# Patient Record
Sex: Male | Born: 1980
Health system: Southern US, Community
[De-identification: ages and names within clinical notes are randomized; demographics above are authoritative.]

## PROBLEM LIST (undated history)

## (undated) DIAGNOSIS — I10 Essential (primary) hypertension: Secondary | ICD-10-CM

## (undated) DIAGNOSIS — F419 Anxiety disorder, unspecified: Secondary | ICD-10-CM

## (undated) DIAGNOSIS — T7840XA Allergy, unspecified, initial encounter: Secondary | ICD-10-CM

## (undated) DIAGNOSIS — G4730 Sleep apnea, unspecified: Secondary | ICD-10-CM

## (undated) DIAGNOSIS — F32A Depression, unspecified: Secondary | ICD-10-CM

## (undated) HISTORY — DX: Sleep apnea, unspecified: G47.30

## (undated) HISTORY — DX: Allergy, unspecified, initial encounter: T78.40XA

## (undated) HISTORY — DX: Anxiety disorder, unspecified: F41.9

## (undated) HISTORY — DX: Depression, unspecified: F32.A

---

## 2002-02-02 ENCOUNTER — Emergency Department (HOSPITAL_COMMUNITY): Admission: EM | Admit: 2002-02-02 | Discharge: 2002-02-02 | Payer: Self-pay | Admitting: Emergency Medicine

## 2005-09-24 ENCOUNTER — Emergency Department (HOSPITAL_COMMUNITY): Admission: EM | Admit: 2005-09-24 | Discharge: 2005-09-24 | Payer: Self-pay | Admitting: Emergency Medicine

## 2006-07-11 ENCOUNTER — Emergency Department (HOSPITAL_COMMUNITY): Admission: EM | Admit: 2006-07-11 | Discharge: 2006-07-11 | Payer: Self-pay | Admitting: Emergency Medicine

## 2006-11-16 ENCOUNTER — Emergency Department (HOSPITAL_COMMUNITY): Admission: EM | Admit: 2006-11-16 | Discharge: 2006-11-16 | Payer: Self-pay | Admitting: Emergency Medicine

## 2020-04-06 ENCOUNTER — Emergency Department (HOSPITAL_COMMUNITY)
Admission: EM | Admit: 2020-04-06 | Discharge: 2020-04-06 | Disposition: A | Discharge status: Home / Self Care | Payer: BLUE CROSS/BLUE SHIELD | Attending: Emergency Medicine | Admitting: Emergency Medicine

## 2020-04-06 ENCOUNTER — Encounter (HOSPITAL_COMMUNITY): Payer: Self-pay

## 2020-04-06 ENCOUNTER — Other Ambulatory Visit: Payer: Self-pay

## 2020-04-06 DIAGNOSIS — L0291 Cutaneous abscess, unspecified: Secondary | ICD-10-CM

## 2020-04-06 DIAGNOSIS — H6003 Abscess of external ear, bilateral: Secondary | ICD-10-CM | POA: Insufficient documentation

## 2020-04-06 DIAGNOSIS — F17210 Nicotine dependence, cigarettes, uncomplicated: Secondary | ICD-10-CM | POA: Diagnosis not present

## 2020-04-06 MED ORDER — DOXYCYCLINE HYCLATE 100 MG PO CAPS: 100.0000 mg | ORAL_CAPSULE | Freq: Two times a day (BID) | ORAL | 0 refills | Status: AC

## 2020-04-06 MED ORDER — LIDOCAINE-EPINEPHRINE 2 %-1:100000 IJ SOLN
20.0000 mL | Freq: Once | INTRAMUSCULAR | Status: AC
  Administered 2020-04-06: 20 mL via INTRADERMAL
  Filled 2020-04-06: qty 1

## 2020-04-06 NOTE — ED Triage Notes (Signed)
Patient states he has had an abscess behind his right earlobe x 3 weeks.

## 2020-04-06 NOTE — Discharge Instructions (Signed)
You were given a prescription for antibiotics. Please take the antibiotic prescription fully.   Please follow up with your primary care provider within 5-7 days for re-evaluation of your symptoms. If you do not have a primary care provider, information for a healthcare clinic has been provided for you to make arrangements for follow up care. Please return to the emergency department for any new or worsening symptoms.  

## 2020-04-06 NOTE — ED Provider Notes (Signed)
[facility]-EMERGENCY DEPT Provider Note   CSN: [id number] Arrival date & time: 04/06/20  1157     History Chief Complaint  Patient presents with   Abscess    Justin Wilson is a 39 y.o. male.  HPI   39 year old male presenting for evaluation of concern for abscesses behind his bilateral ears.  Symptoms have been present for 1 to 3 weeks.  Reports pain and swelling to the areas.  Has had no drainage.  Denies any current fevers.  History reviewed. No pertinent past medical history.  There are no problems to display for this patient.   History reviewed. No pertinent surgical history.     Family History  Family history unknown: Yes    Social History   Tobacco Use   Smoking status: Current Some Day Smoker    Types: Cigarettes   Smokeless tobacco: Never Used  Vaping Use   Vaping Use: Never used  Substance Use Topics   Alcohol use: Yes   Drug use: Yes    Types: Marijuana    Home Medications Prior to Admission medications   Medication Sig Start Date End Date Taking? Authorizing Provider  doxycycline (VIBRAMYCIN) 100 MG capsule Take 1 capsule (100 mg total) by mouth 2 (two) times daily for 7 days. 04/06/20 04/13/20  Madason Rauls S, PA-C    Allergies    Patient has no known allergies.  Review of Systems   Review of Systems  Constitutional: Negative for fever.  Skin:       abscess    Physical Exam Updated Vital Signs BP (!) 150/78    Pulse 80    Temp 98.1 F (36.7 C) (Oral)    Resp 16    Ht 6\' 3"  (1.905 m)    SpO2 97%   Physical Exam Constitutional:      General: He is not in acute distress.    Appearance: He is well-developed.  HENT:     Ears:     Comments: 1.5 cm area of fluctuance/erythema posterior to the right earlobe. 0.5 cm area of fluctuance posterior to the left earlobe. Eyes:     Conjunctiva/sclera: Conjunctivae normal.  Cardiovascular:     Rate and Rhythm: Normal rate.  Pulmonary:     Effort: Pulmonary  effort is normal.  Skin:    General: Skin is warm and dry.  Neurological:     Mental Status: He is alert and oriented to person, place, and time.     ED Results / Procedures / Treatments   Labs (all labs ordered are listed, but only abnormal results are displayed) Labs Reviewed - No data to display  EKG None  Radiology No results found.  Procedures . Incision and Drainage  Date/Time: 04/06/2020 1:43 PM Performed by: 04/08/2020, PA-C Authorized by: Karrie Meres, PA-C   Consent:    Consent obtained:  Verbal   Consent given by:  Patient   Risks discussed:  Bleeding, incomplete drainage, pain and damage to other organs   Alternatives discussed:  No treatment Universal protocol:    Procedure explained and questions answered to patient or proxy's satisfaction: yes     Relevant documents present and verified: yes     Test results available and properly labeled: yes     Imaging studies available: yes     Required blood products, implants, devices, and special equipment available: yes     Site/side marked: yes     Immediately prior to procedure a time out was  called: yes     Patient identity confirmed:  Verbally with patient Location:    Type:  Abscess   Size:  1cm   Location: right posterior ear. Pre-procedure details:    Skin preparation:  Betadine Anesthesia (see MAR for exact dosages):    Anesthesia method:  Local infiltration   Local anesthetic:  Lidocaine 2% WITH epi Procedure type:    Complexity:  Complex Procedure details:    Incision types:  Single straight   Incision depth:  Subcutaneous   Scalpel blade:  11   Drainage:  Purulent   Drainage amount:  Moderate   Wound treatment:  Wound left open Post-procedure details:    Patient tolerance of procedure:  Tolerated well, no immediate complications .Marland KitchenIncision and Drainage  Date/Time: 04/06/2020 1:44 PM Performed by: Karrie Meres, PA-C Authorized by: Karrie Meres, PA-C   Consent:     Consent obtained:  Verbal   Consent given by:  Patient   Risks discussed:  Bleeding, incomplete drainage and pain   Alternatives discussed:  No treatment Location:    Type:  Abscess   Size:  0.5   Location: left posterior ear. Pre-procedure details:    Skin preparation:  Betadine Anesthesia (see MAR for exact dosages):    Anesthesia method:  Local infiltration   Local anesthetic:  Lidocaine 2% WITH epi Procedure type:    Complexity:  Simple Procedure details:    Incision types:  Stab incision   Incision depth:  Dermal   Scalpel blade:  11   Wound management:  Probed and deloculated   Drainage:  Purulent   Drainage amount:  Scant   Wound treatment:  Wound left open   Packing materials:  None Post-procedure details:    Patient tolerance of procedure:  Tolerated well, no immediate complications   (including critical care time)  Medications Ordered in ED Medications  lidocaine-EPINEPHrine (XYLOCAINE W/EPI) 2 %-1:100000 (with pres) injection 20 mL (20 mLs Intradermal Given by Other 04/06/20 1258)    ED Course  I have reviewed the triage vital signs and the nursing notes.  Pertinent labs & imaging results that were available during my care of the patient were reviewed by me and considered in my medical decision making (see chart for details).    MDM Rules/Calculators/A&P                          Patient with skin abscess amenable to incision and drainage.  Abscess was not large enough to warrant packing or drain. Encouraged home warm soaks and flushing.  Mild signs of cellulitis is surrounding skin.  Will d/c to home.  No antibiotic therapy is indicated.   Final Clinical Impression(s) / ED Diagnoses Final diagnoses:  Abscess    Rx / DC Orders ED Discharge Orders         Ordered    doxycycline (VIBRAMYCIN) 100 MG capsule  2 times daily     Discontinue  Reprint     04/06/20 [address] [place], PA-C 04/06/20 1347    Sabas Sous, MD 04/06/20  1547

## 2021-01-11 ENCOUNTER — Encounter (HOSPITAL_COMMUNITY): Payer: Self-pay

## 2021-01-11 ENCOUNTER — Emergency Department (HOSPITAL_COMMUNITY)
Admission: EM | Admit: 2021-01-11 | Discharge: 2021-01-11 | Disposition: A | Discharge status: Home / Self Care | Payer: BC Managed Care – PPO | Attending: Emergency Medicine | Admitting: Emergency Medicine

## 2021-01-11 ENCOUNTER — Other Ambulatory Visit: Payer: Self-pay

## 2021-01-11 ENCOUNTER — Emergency Department (HOSPITAL_COMMUNITY): Payer: BC Managed Care – PPO

## 2021-01-11 DIAGNOSIS — F17210 Nicotine dependence, cigarettes, uncomplicated: Secondary | ICD-10-CM | POA: Diagnosis not present

## 2021-01-11 DIAGNOSIS — J069 Acute upper respiratory infection, unspecified: Secondary | ICD-10-CM

## 2021-01-11 DIAGNOSIS — J101 Influenza due to other identified influenza virus with other respiratory manifestations: Secondary | ICD-10-CM | POA: Diagnosis not present

## 2021-01-11 DIAGNOSIS — Z20822 Contact with and (suspected) exposure to covid-19: Secondary | ICD-10-CM | POA: Diagnosis not present

## 2021-01-11 DIAGNOSIS — E669 Obesity, unspecified: Secondary | ICD-10-CM | POA: Diagnosis not present

## 2021-01-11 DIAGNOSIS — R059 Cough, unspecified: Secondary | ICD-10-CM | POA: Diagnosis present

## 2021-01-11 LAB — RESP PANEL BY RT-PCR (FLU A&B, COVID) ARPGX2
Influenza A by PCR: POSITIVE — AB
Influenza B by PCR: NEGATIVE
SARS Coronavirus 2 by RT PCR: NEGATIVE

## 2021-01-11 NOTE — ED Notes (Signed)
Pt given discharge papers, advised to take OTC medicine for any s/s and to quarantine at home until receives test results. Pt given work note from provider. Pt understood, no questions, ready for dc. Steady gait, A&O x4

## 2021-01-11 NOTE — ED Triage Notes (Signed)
Pt reports generalized body aches, fatigue, chills, and coughing until vomiting. Sx began yesterday morning.

## 2021-01-11 NOTE — Discharge Instructions (Addendum)
Home to quarantine, follow-up in your MyChart account for your COVID/flu test results later today. You may take Coricidin HBP as needed as directed for symptom relief.  Can also take Tylenol as needed as directed. Follow-up with your doctor if symptoms or not improving.  Return to the emergency room for new or worsening symptoms. Return to work note given however if your COVID test is positive, follow your employer's protocol.

## 2021-01-11 NOTE — ED Provider Notes (Signed)
[facility]-EMERGENCY DEPT Provider Note   CSN: [id number] Arrival date & time: 01/11/21  0604     History Chief Complaint  Patient presents with  . Generalized Body Aches    Justin Wilson is a 40 y.o. male.  40 year old male with no significant past medical history presents with complaint of cough and feeling unwell x4 days.  Patient states he woke up last night with chills and sweats which prompted him to come to the emergency room.  States his cough is productive with thick mucus.  Patient is a daily smoker, denies history of chronic lung disease.  Denies any sick contacts.  No other complaints or concerns.        History reviewed. No pertinent past medical history.  There are no problems to display for this patient.   History reviewed. No pertinent surgical history.     Family History  Family history unknown: Yes    Social History   Tobacco Use  . Smoking status: Current Some Day Smoker    Types: Cigarettes  . Smokeless tobacco: Never Used  Vaping Use  . Vaping Use: Never used  Substance Use Topics  . Alcohol use: Yes  . Drug use: Yes    Types: Marijuana    Home Medications Prior to Admission medications   Not on File    Allergies    Patient has no known allergies.  Review of Systems   Review of Systems  Constitutional: Positive for chills and diaphoresis.  HENT: Positive for congestion. Negative for sore throat.   Respiratory: Positive for cough and shortness of breath.   Cardiovascular: Negative for chest pain.  Gastrointestinal: Negative for abdominal pain, nausea and vomiting.  Musculoskeletal: Positive for back pain. Negative for arthralgias and myalgias.  Skin: Negative for rash and wound.  Allergic/Immunologic: Negative for immunocompromised state.  Neurological: Negative for weakness.  Hematological: Negative for adenopathy.  Psychiatric/Behavioral: Negative for confusion.  All other systems reviewed and are  negative.   Physical Exam Updated Vital Signs BP (!) 151/89   Pulse 78   Temp 98.6 F (37 C) (Oral)   Resp 18   Ht 6\' 3"  (1.905 m)   Wt (!) 149.7 kg   SpO2 95%   BMI 41.25 kg/m   Physical Exam Vitals and nursing note reviewed.  Constitutional:      General: He is not in acute distress.    Appearance: He is well-developed. He is obese. He is not diaphoretic.  HENT:     Head: Normocephalic and atraumatic.     Nose: Congestion present.  Eyes:     Conjunctiva/sclera: Conjunctivae normal.  Cardiovascular:     Rate and Rhythm: Normal rate and regular rhythm.     Pulses: Normal pulses.     Heart sounds: Normal heart sounds.  Pulmonary:     Effort: Pulmonary effort is normal.     Breath sounds: Normal breath sounds.  Abdominal:     Palpations: Abdomen is soft.     Tenderness: There is no abdominal tenderness.  Musculoskeletal:     Cervical back: Neck supple.     Right lower leg: No edema.     Left lower leg: No edema.  Skin:    General: Skin is warm and dry.     Findings: No erythema or rash.  Neurological:     Mental Status: He is alert and oriented to person, place, and time.  Psychiatric:        Behavior: Behavior normal.  ED Results / Procedures / Treatments   Labs (all labs ordered are listed, but only abnormal results are displayed) Labs Reviewed  RESP PANEL BY RT-PCR (FLU A&B, COVID) ARPGX2 - Abnormal; Notable for the following components:      Result Value   Influenza A by PCR POSITIVE (*)    All other components within normal limits    EKG None  Radiology DG Chest Port 1 View  Result Date: 01/11/2021 CLINICAL DATA:  Cough and chills EXAM: PORTABLE CHEST 1 VIEW COMPARISON:  None. FINDINGS: Lungs are clear. Heart size and pulmonary vascularity are normal. No adenopathy. No bone lesions. IMPRESSION: Lungs clear.  Cardiac silhouette normal. Electronically Signed   By: Bretta Bang III M.D.   On: 01/11/2021 07:56    Procedures Procedures    Medications Ordered in ED Medications - No data to display  ED Course  I have reviewed the triage vital signs and the nursing notes.  Pertinent labs & imaging results that were available during my care of the patient were reviewed by me and considered in my medical decision making (see chart for details).  Clinical Course as of 01/11/21 0843  Fri Jan 11, 2021  5747 40 year old male with viral URI symptoms as above.  Triage notes of vomiting, states he is coughing up mucus, not actually vomiting.  On exam, is well-appearing.  No history of chronic lung disease, is a daily smoker. Exam is unremarkable, he is mildly hypertensive otherwise vitals are reassuring. Chest x-ray is unremarkable. Patient is discharged with recommendation for OTC Coricidin HBP for symptom relief, return for new or worsening symptoms, quarantine pending COVID/flu test results. [LM]  [id number] At time of discharge, respiratory panel returns and is positive for influenza a.  Patient is outside of treatment window with antivirals, plan remains unchanged. [LM]    Clinical Course User Index [LM] Alden Hipp   MDM Rules/Calculators/A&P                          Final Clinical Impression(s) / ED Diagnoses Final diagnoses:  Upper respiratory tract infection, unspecified type  Influenza A    Rx / DC Orders ED Discharge Orders    None       Jeannie Fend, PA-C 01/11/21 8338    Horton, Clabe Seal, DO 01/19/21 1509

## 2021-01-11 NOTE — ED Notes (Signed)
X-ray at bedside

## 2021-04-15 ENCOUNTER — Other Ambulatory Visit: Payer: Self-pay

## 2021-04-15 ENCOUNTER — Encounter (HOSPITAL_COMMUNITY): Payer: Self-pay

## 2021-04-15 ENCOUNTER — Ambulatory Visit (HOSPITAL_COMMUNITY)
Admission: EM | Admit: 2021-04-15 | Discharge: 2021-04-15 | Disposition: A | Discharge status: Home / Self Care | Payer: BC Managed Care – PPO | Attending: Physician Assistant | Admitting: Physician Assistant

## 2021-04-15 ENCOUNTER — Ambulatory Visit (INDEPENDENT_AMBULATORY_CARE_PROVIDER_SITE_OTHER): Payer: BC Managed Care – PPO

## 2021-04-15 DIAGNOSIS — M25572 Pain in left ankle and joints of left foot: Secondary | ICD-10-CM

## 2021-04-15 DIAGNOSIS — M79672 Pain in left foot: Secondary | ICD-10-CM | POA: Diagnosis not present

## 2021-04-15 MED ORDER — MELOXICAM 15 MG PO TABS
15.0000 mg | ORAL_TABLET | Freq: Every day | ORAL | 0 refills | Status: DC
Start: 2021-04-15 — End: 2021-06-03

## 2021-04-15 NOTE — Discharge Instructions (Addendum)
Your x-rays showed some arthritis in your ankle as well as some heel spurs but did not show any fracture which is good news.  We are going to start you on medication to help with pain and inflammation known as Mobic that you will take daily.  You should not take additional NSAIDs with this medication including aspirin, ibuprofen/Advil, naproxen/Aleve as it can cause stomach bleeding.  You can take Tylenol.  Wear good supportive footwear.  If your symptoms or not improving please follow-up with [facility] as we discussed.

## 2021-04-15 NOTE — ED Triage Notes (Signed)
Pt in with c/o left foot pain x 2 weeks  States it hurts to put pressure on his foot  Denies any recent injury

## 2021-04-15 NOTE — ED Provider Notes (Signed)
[facility]    CSN: [id number] Arrival date & time: 04/15/21  0900      History   Chief Complaint Chief Complaint  Patient presents with   Foot Pain    HPI Justin Wilson is a 40 y.o. male.   Patient presents today with a 2-week history of left heel pain.  He reports over the past week pain has increased.  He denies any pain at rest but reports this increases to 8 on a 0-10 pain scale with attempted ambulation, localized to left lateral ankle with radiation into left heel, described as sharp, no alleviating factors identified.  Patient has not tried any over-the-counter medications for symptom management.  He denies any known injury or increase in activity prior to symptom onset.  Denies any recent changes to footwear.  He denies any weakness, numbness, paresthesias in foot or toes.  He denies previous injury or surgery to ankle and foot.  He is having difficulty with daily activities as a result of symptoms.   History reviewed. No pertinent past medical history.  There are no problems to display for this patient.   History reviewed. No pertinent surgical history.     Home Medications    Prior to Admission medications   Medication Sig Start Date End Date Taking? Authorizing Provider  meloxicam (MOBIC) 15 MG tablet Take 1 tablet (15 mg total) by mouth daily. 04/15/21  Yes Melissa Pulido, Noberto Retort, PA-C    Family History Family History  Family history unknown: Yes    Social History Social History   Tobacco Use   Smoking status: Some Days    Types: Cigarettes   Smokeless tobacco: Never  Vaping Use   Vaping Use: Never used  Substance Use Topics   Alcohol use: Yes   Drug use: Yes    Types: Marijuana     Allergies   Patient has no known allergies.   Review of Systems Review of Systems  Constitutional:  Positive for activity change. Negative for appetite change, fatigue and fever.  Respiratory:  Negative for cough and shortness of breath.    Cardiovascular:  Negative for chest pain.  Gastrointestinal:  Negative for abdominal pain, diarrhea, nausea and vomiting.  Musculoskeletal:  Positive for arthralgias, gait problem and joint swelling. Negative for myalgias.  Neurological:  Negative for dizziness, weakness, light-headedness, numbness and headaches.    Physical Exam Triage Vital Signs ED Triage Vitals  Enc Vitals Group     BP 04/15/21 1035 134/86     Pulse Rate 04/15/21 1035 71     Resp 04/15/21 1034 20     Temp 04/15/21 1034 98.1 F (36.7 C)     Temp Source 04/15/21 1034 Oral     SpO2 04/15/21 1035 96 %     Weight --      Height --      Head Circumference --      Peak Flow --      Pain Score 04/15/21 1033 8     Pain Loc --      Pain Edu? --      Excl. in GC? --    No data found.  Updated Vital Signs BP 134/86 (BP Location: Right Arm)   Pulse 71   Temp 98.1 F (36.7 C) (Oral)   Resp 20   SpO2 96%   Visual Acuity Right Eye Distance:   Left Eye Distance:   Bilateral Distance:    Right Eye Near:   Left Eye Near:  Bilateral Near:     Physical Exam Vitals reviewed.  Constitutional:      General: He is awake.     Appearance: Normal appearance. He is normal weight. He is not ill-appearing.     Comments: Very pleasant male appears stated age no acute distress  HENT:     Head: Normocephalic and atraumatic.  Cardiovascular:     Rate and Rhythm: Normal rate and regular rhythm.     Pulses:          Posterior tibial pulses are 2+ on the right side and 2+ on the left side.     Heart sounds: Normal heart sounds, S1 normal and S2 normal. No murmur heard. Pulmonary:     Effort: Pulmonary effort is normal.     Breath sounds: Normal breath sounds. No stridor. No wheezing, rhonchi or rales.     Comments: Clear to auscultation bilaterally Musculoskeletal:     Left ankle: Swelling present. Tenderness present over the lateral malleolus. Decreased range of motion. Anterior drawer test negative.     Left  Achilles Tendon: No tenderness. Thompson's test negative.     Left foot: Normal range of motion. Tenderness and bony tenderness present. No swelling. Normal pulse.     Comments: Left ankle/foot: Foot neurovascularly intact.  Mild tenderness palpation and swelling noted lateral ankle with radiation to calcaneus.  No deformity noted.  Neurological:     Mental Status: He is alert.  Psychiatric:        Behavior: Behavior is cooperative.     UC Treatments / Results  Labs (all labs ordered are listed, but only abnormal results are displayed) Labs Reviewed - No data to display  EKG   Radiology DG Ankle Complete Left  Result Date: 04/15/2021 CLINICAL DATA:  40 year old male with left ankle and heel pain for 2 weeks with no known injury. EXAM: LEFT ANKLE COMPLETE - 3+ VIEW COMPARISON:  None. FINDINGS: Bone mineralization is within normal limits. Mortise joint alignment preserved. Talar dome intact. No evidence of joint effusion. Joint space loss about the ankle with small chronic appearing ossific fragments at both the medial and lateral malleolus. Mild degenerative spurring including at the posterior calcaneus. No acute osseous abnormality identified. IMPRESSION: Chronic degenerative and/or posttraumatic changes at the ankle. No acute osseous abnormality identified. Electronically Signed   By: Odessa Fleming M.D.   On: 04/15/2021 11:27   DG Os Calcis Left  Result Date: 04/15/2021 CLINICAL DATA:  40 year old male with left ankle and heel pain for 2 weeks with no known injury. EXAM: LEFT OS CALCIS - 2+ VIEW COMPARISON:  Left ankle series today. FINDINGS: Calcaneus appears intact with normal bone mineralization. Mild degenerative spurring at both the plantar calcaneus and the Achilles insertion. Other visible osseous structures appear intact. Soft tissue contours appear within normal limits. IMPRESSION: Mild degenerative spurring. No acute osseous abnormality identified. Electronically Signed   By: Odessa Fleming M.D.    On: 04/15/2021 11:28    Procedures Procedures (including critical care time)  Medications Ordered in UC Medications - No data to display  Initial Impression / Assessment and Plan / UC Course  I have reviewed the triage vital signs and the nursing notes.  Pertinent labs & imaging results that were available during my care of the patient were reviewed by me and considered in my medical decision making (see chart for details).      X-ray obtained of calcaneus and ankle show degenerative changes without acute findings.  Patient was started  on Mobic 15 mg daily with instruction to avoid additional NSAIDs due to risk of GI bleeding.  He can use Tylenol for breakthrough pain.  He was placed in ankle brace for comfort.  Discussed that if he has continued symptoms he should follow-up with podiatry and was given contact information for specialist.  Discussed alarm symptoms that warrant emergent evaluation.  Strict return precautions given to which patient expressed understanding.  Final Clinical Impressions(s) / UC Diagnoses   Final diagnoses:  Acute left ankle pain  Pain of left heel     Discharge Instructions      Your x-rays showed some arthritis in your ankle as well as some heel spurs but did not show any fracture which is good news.  We are going to start you on medication to help with pain and inflammation known as Mobic that you will take daily.  You should not take additional NSAIDs with this medication including aspirin, ibuprofen/Advil, naproxen/Aleve as it can cause stomach bleeding.  You can take Tylenol.  Wear good supportive footwear.  If your symptoms or not improving please follow-up with [facility] as we discussed.     ED Prescriptions     Medication Sig Dispense Auth. Provider   meloxicam (MOBIC) 15 MG tablet Take 1 tablet (15 mg total) by mouth daily. 21 tablet Zamiyah Resendes, Noberto Retort, PA-C      PDMP not reviewed this encounter.   Jeani Hawking, PA-C 04/15/21  1152

## 2021-04-18 ENCOUNTER — Ambulatory Visit (HOSPITAL_BASED_OUTPATIENT_CLINIC_OR_DEPARTMENT_OTHER): Payer: BC Managed Care – PPO | Admitting: Family Medicine

## 2021-04-18 ENCOUNTER — Other Ambulatory Visit: Payer: Self-pay

## 2021-04-18 ENCOUNTER — Other Ambulatory Visit (HOSPITAL_COMMUNITY)
Admission: RE | Admit: 2021-04-18 | Discharge: 2021-04-18 | Disposition: A | Discharge status: Home / Self Care | Payer: BC Managed Care – PPO | Source: Ambulatory Visit | Attending: Family Medicine | Admitting: Family Medicine

## 2021-04-18 ENCOUNTER — Encounter (HOSPITAL_BASED_OUTPATIENT_CLINIC_OR_DEPARTMENT_OTHER): Payer: Self-pay | Admitting: Family Medicine

## 2021-04-18 VITALS — BP 150/102 | HR 79 | Ht 75.0 in | Wt 330.1 lb

## 2021-04-18 DIAGNOSIS — Z113 Encounter for screening for infections with a predominantly sexual mode of transmission: Secondary | ICD-10-CM | POA: Diagnosis not present

## 2021-04-18 DIAGNOSIS — M722 Plantar fascial fibromatosis: Secondary | ICD-10-CM | POA: Insufficient documentation

## 2021-04-18 DIAGNOSIS — R030 Elevated blood-pressure reading, without diagnosis of hypertension: Secondary | ICD-10-CM | POA: Insufficient documentation

## 2021-04-18 DIAGNOSIS — G8929 Other chronic pain: Secondary | ICD-10-CM

## 2021-04-18 DIAGNOSIS — M545 Low back pain, unspecified: Secondary | ICD-10-CM

## 2021-04-18 NOTE — Assessment & Plan Note (Signed)
Suspect mechanical low back pain, no red flags on history or exam Can utilize conservative measures elbow pain control as needed including OTC medications, home exercises Discussed possibility of PT referral, patient wished to hold off for now

## 2021-04-18 NOTE — Assessment & Plan Note (Signed)
Recommend gradual increase in weekly physical activity Consider referral to nutritionist Follow-up at future office visits Check labs as below

## 2021-04-18 NOTE — Assessment & Plan Note (Signed)
Blood pressure elevated in the office today, no personal history of hypertension Recommend monitoring at home, either with home blood pressure cuff or when at grocery store pharmacy if blood pressure station available Monitor at future office visit Recommend lifestyle modifications including DASH diet, regular weekly exercise Working towards gradual, healthy weight loss would likely also be beneficial

## 2021-04-18 NOTE — Progress Notes (Signed)
New Patient Office Visit  Subjective:  Patient ID: Justin Wilson, male    DOB: 26-Apr-1981  Age: 40 y.o. MRN: [id number]  CC:  Chief Complaint  Patient presents with   Establish Care    No Prior PCP   Back Pain    Patient has a bone spur and arthritis in his left foot and has been over compemnsating with is gait causing lower back pain and his back "to go out"   Arthritis    Patient recently found out he has a bone spur and arthritis in his left foot. He has a brace on.     HPI Justin Wilson is a 40 year old male presenting to establish in clinic.  Current concerns today include left heel pain as well as low back pain.  Has not seen primary care physician in the past several years, denies any significant past medical history, no hospitalizations, no surgeries in the past.  Left heel pain: Has been going on for about 2 weeks, was seen at urgent care.  Had imaging completed at urgent care which showed some chronic degenerative changes, no acute abnormality.  Was provided with a lace up ankle brace, meloxicam.  Feels that brace has been helpful, pain has improved since being seen at urgent care.  Pain primarily over posterior heel as well as plantar surface of left heel.  Denies any prior similar episodes, no recent injuries.  Low back pain: Started around the same time as the heel pain.  Primarily in the lower back, bilaterally.  No radiation of pain into lower extremities.  Denies any associated numbness or tingling.  Does not have any bowel or bladder incontinence.  No systemic symptoms such as fevers, chills, night sweats.  Has had prior episodes of back pain for many years that will come and go.  Current symptoms feel similar to prior episodes.  Patient works as a Transport planner.  Currently smokes about 1 pack/day, has been smoking for about 10 years.  Primary tooth outside work include doing yard work, occasionally going to [employer]  History reviewed. No pertinent past  medical history.  History reviewed. No pertinent surgical history.  Family History  Family history unknown: Yes    Social History   Socioeconomic History   Marital status: Single    Spouse name: Not on file   Number of children: Not on file   Years of education: Not on file   Highest education level: Not on file  Occupational History   Occupation: Pensions consultant    Comment: Warehouse  Tobacco Use   Smoking status: Every Day    Packs/day: 1.00    Types: Cigarettes   Smokeless tobacco: Never  Vaping Use   Vaping Use: Never used  Substance and Sexual Activity   Alcohol use: Yes   Drug use: Yes    Types: Marijuana    Comment: recently quit about 6 months ago   Sexual activity: Yes  Other Topics Concern   Not on file  Social History Narrative   Not on file   Social Determinants of Health   Financial Resource Strain: Not on file  Food Insecurity: Not on file  Transportation Needs: Not on file  Physical Activity: Not on file  Stress: Not on file  Social Connections: Not on file  Intimate Partner Violence: Not on file    Objective:   Today's Vitals: BP (!) 150/102   Pulse 79   Ht 6\' 3"  (1.905 m)   Wt )  330 lb 1.6 oz (149.7 kg)   SpO2 96%   BMI 41.26 kg/m   Physical Exam  Pleasant 40 year old male in no acute distress Cardiovascular exam with regular rate and rhythm, no murmurs appreciated Lungs clear to auscultation bilaterally Left ankle: Obvious swelling, bruising or erythema: absent Deformity of the ankle: absent Tenderness to palpation at Achilles tendon insertion, plantar fascia insertion Active ROM: full range of motion Passive ROM: full range of motion Strength: Normal strength for plantar flexion, dorsiflexion, inversion, eversion Mildly positive windlass test Neurovascular exam: intact Lumbar spine: Visual inspection without obvious abnormality No tenderness to palpation over spinous processes, mild tenderness palpation through paraspinal  musculature bilaterally Normal forward flexion, no pain elicited.  Normal back extension, no pain elicited. Normal trunk rotation range of motion bilaterally Negative straight leg raise  Assessment & Plan:   Problem List Items Addressed This Visit       Musculoskeletal and Integument   Plantar fasciitis of left foot    Suspect pain over plantar aspect of left heel is related to plantar fasciitis Discussed etiology, treatment recommendations Can use frozen water bottle to help with icing and massaging the area Discussed home exercises including stretching of calf, posterior chain of lower extremity Discussed possibility of PT referral, patient wishes to hold off on this for now Consider use of anterior night splint        Other   Elevated blood pressure reading without diagnosis of hypertension    Blood pressure elevated in the office today, no personal history of hypertension Recommend monitoring at home, either with home blood pressure cuff or when at grocery store pharmacy if blood pressure station available Monitor at future office visit Recommend lifestyle modifications including DASH diet, regular weekly exercise Working towards gradual, healthy weight loss would likely also be beneficial      Relevant Orders   CBC with Differential/Platelet   Comprehensive metabolic panel   Lipid panel   Severe obesity (BMI >= 40) (HCC)    Recommend gradual increase in weekly physical activity Consider referral to nutritionist Follow-up at future office visits Check labs as below      Relevant Orders   CBC with Differential/Platelet   Comprehensive metabolic panel   Hemoglobin A1c   Lipid panel   Low back pain    Suspect mechanical low back pain, no red flags on history or exam Can utilize conservative measures elbow pain control as needed including OTC medications, home exercises Discussed possibility of PT referral, patient wished to hold off for now      Other Visit  Diagnoses     Routine screening for STI (sexually transmitted infection)    -  Primary   Relevant Orders   Hepatitis C antibody   HIV Antibody (routine testing w rflx)   RPR   Urine cytology ancillary only       Outpatient Encounter Medications as of 04/18/2021  Medication Sig   meloxicam (MOBIC) 15 MG tablet Take 1 tablet (15 mg total) by mouth daily.   No facility-administered encounter medications on file as of 04/18/2021.   Spent 45 minutes on this patient encounter, including preparation, chart review, face-to-face counseling with patient and coordination of care, and documentation of encounter  Follow-up: Return in about 4 weeks (around 05/16/2021) for Follow Up.  Plan for follow-up in about 4 weeks to monitor above, review labs, complete CPE.  Complete Epworth sleepiness scale at next visit  Clavin Ruhlman J De Peru, MD

## 2021-04-18 NOTE — Patient Instructions (Signed)
  Medication Instructions:  Your physician recommends that you continue on your current medications as directed. Please refer to the Current Medication list given to you today. --If you need a refill on any your medications before your next appointment, please call your pharmacy first. If no refills are authorized on file call the office.--  Lab Work: Your physician has recommended that you have lab work today: CBC, CMP, Lipid, A1C, HIV, Hep C, Urine G/C, Syphilis  If you have labs (blood work) drawn today and your tests are completely normal, you will receive your results via MyChart message OR a phone call from our staff.  Please ensure you check your voicemail in the event that you authorized detailed messages to be left on a delegated number. If you have any lab test that is abnormal or we need to change your treatment, we will call you to review the results.  Follow-Up: Your next appointment:   Your physician recommends that you schedule a follow-up appointment in: 1 MONTHS with Dr. de Peru  Thanks for letting us be apart of your health journey!!  Primary Care and Sports Medicine   Dr. Ceasar Mons Peru   We encourage you to activate your patient portal called "MyChart".  Sign up information is provided on this After Visit Summary.  MyChart is used to connect with patients for Virtual Visits (Telemedicine).  Patients are able to view lab/test results, encounter notes, upcoming appointments, etc.  Non-urgent messages can be sent to your provider as well. To learn more about what you can do with MyChart, please visit --  [email or web address].

## 2021-04-18 NOTE — Assessment & Plan Note (Signed)
Suspect pain over plantar aspect of left heel is related to plantar fasciitis Discussed etiology, treatment recommendations Can use frozen water bottle to help with icing and massaging the area Discussed home exercises including stretching of calf, posterior chain of lower extremity Discussed possibility of PT referral, patient wishes to hold off on this for now Consider use of anterior night splint

## 2021-04-19 LAB — CBC WITH DIFFERENTIAL/PLATELET
Basophils Absolute: 0.1 10*3/uL (ref 0.0–0.2)
Basos: 1 %
EOS (ABSOLUTE): 0.3 10*3/uL (ref 0.0–0.4)
Eos: 3 %
Hematocrit: 45.2 % (ref 37.5–51.0)
Hemoglobin: 15.3 g/dL (ref 13.0–17.7)
Immature Grans (Abs): 0 10*3/uL (ref 0.0–0.1)
Immature Granulocytes: 0 %
Lymphocytes Absolute: 2.7 10*3/uL (ref 0.7–3.1)
Lymphs: 31 %
MCH: 32.3 pg (ref 26.6–33.0)
MCHC: 33.8 g/dL (ref 31.5–35.7)
MCV: 95 fL (ref 79–97)
Monocytes Absolute: 0.7 10*3/uL (ref 0.1–0.9)
Monocytes: 8 %
Neutrophils Absolute: 5.1 10*3/uL (ref 1.4–7.0)
Neutrophils: 57 %
Platelets: 216 10*3/uL (ref 150–450)
RBC: 4.74 x10E6/uL (ref 4.14–5.80)
RDW: 12 % (ref 11.6–15.4)
WBC: 9 10*3/uL (ref 3.4–10.8)

## 2021-04-19 LAB — LIPID PANEL
Chol/HDL Ratio: 4.1 ratio (ref 0.0–5.0)
Cholesterol, Total: 185 mg/dL (ref 100–199)
HDL: 45 mg/dL (ref >39)
LDL Chol Calc (NIH): 119 mg/dL — ABNORMAL HIGH (ref 0–99)
Triglycerides: 116 mg/dL (ref 0–149)
VLDL Cholesterol Cal: 21 mg/dL (ref 5–40)

## 2021-04-19 LAB — URINE CYTOLOGY ANCILLARY ONLY
Chlamydia: NEGATIVE
Comment: NEGATIVE
Comment: NEGATIVE
Comment: NORMAL
Neisseria Gonorrhea: NEGATIVE
Trichomonas: NEGATIVE

## 2021-04-19 LAB — COMPREHENSIVE METABOLIC PANEL
ALT: 17 IU/L (ref 0–44)
AST: 19 IU/L (ref 0–40)
Albumin/Globulin Ratio: 1.6 (ref 1.2–2.2)
Albumin: 4.2 g/dL (ref 4.0–5.0)
Alkaline Phosphatase: 44 IU/L (ref 44–121)
BUN/Creatinine Ratio: 11 (ref 9–20)
BUN: 10 mg/dL (ref 6–24)
Bilirubin Total: 0.3 mg/dL (ref 0.0–1.2)
CO2: 21 mmol/L (ref 20–29)
Calcium: 9 mg/dL (ref 8.7–10.2)
Chloride: 107 mmol/L — ABNORMAL HIGH (ref 96–106)
Creatinine, Ser: 0.87 mg/dL (ref 0.76–1.27)
Globulin, Total: 2.6 g/dL (ref 1.5–4.5)
Glucose: 87 mg/dL (ref 65–99)
Potassium: 4.3 mmol/L (ref 3.5–5.2)
Sodium: 142 mmol/L (ref 134–144)
Total Protein: 6.8 g/dL (ref 6.0–8.5)
eGFR: 111 mL/min/{1.73_m2} (ref >59)

## 2021-04-19 LAB — RPR: RPR Ser Ql: NONREACTIVE

## 2021-04-19 LAB — HEMOGLOBIN A1C
Est. average glucose Bld gHb Est-mCnc: 108 mg/dL
Hgb A1c MFr Bld: 5.4 % (ref 4.8–5.6)

## 2021-04-19 LAB — HIV ANTIBODY (ROUTINE TESTING W REFLEX): HIV Screen 4th Generation wRfx: NONREACTIVE

## 2021-04-19 LAB — HEPATITIS C ANTIBODY: Hep C Virus Ab: <0.1 s/co ratio (ref 0.0–0.9)

## 2021-04-29 ENCOUNTER — Encounter (HOSPITAL_BASED_OUTPATIENT_CLINIC_OR_DEPARTMENT_OTHER): Payer: Self-pay | Admitting: Family Medicine

## 2021-06-03 ENCOUNTER — Encounter (HOSPITAL_BASED_OUTPATIENT_CLINIC_OR_DEPARTMENT_OTHER): Payer: Self-pay | Admitting: Family Medicine

## 2021-06-03 ENCOUNTER — Ambulatory Visit (INDEPENDENT_AMBULATORY_CARE_PROVIDER_SITE_OTHER): Payer: BC Managed Care – PPO | Admitting: Family Medicine

## 2021-06-03 ENCOUNTER — Other Ambulatory Visit: Payer: Self-pay

## 2021-06-03 VITALS — BP 150/96 | HR 83 | Ht 75.0 in | Wt 331.6 lb

## 2021-06-03 DIAGNOSIS — M545 Low back pain, unspecified: Secondary | ICD-10-CM | POA: Diagnosis not present

## 2021-06-03 DIAGNOSIS — G8929 Other chronic pain: Secondary | ICD-10-CM | POA: Diagnosis not present

## 2021-06-03 DIAGNOSIS — Z0000 Encounter for general adult medical examination without abnormal findings: Secondary | ICD-10-CM

## 2021-06-03 DIAGNOSIS — R350 Frequency of micturition: Secondary | ICD-10-CM

## 2021-06-03 LAB — POCT URINALYSIS DIPSTICK
Bilirubin, UA: NEGATIVE
Blood, UA: NEGATIVE
Glucose, UA: NEGATIVE
Ketones, UA: NEGATIVE
Leukocytes, UA: NEGATIVE
Nitrite, UA: NEGATIVE
Protein, UA: NEGATIVE
Spec Grav, UA: 1.025 (ref 1.010–1.025)
Urobilinogen, UA: 0.2 E.U./dL
pH, UA: 6 (ref 5.0–8.0)

## 2021-06-03 MED ORDER — MELOXICAM 15 MG PO TABS: 15.0000 mg | ORAL_TABLET | Freq: Every day | ORAL | 1 refills | Status: DC

## 2021-06-03 NOTE — Assessment & Plan Note (Signed)
Labs completed at time of last visit were unremarkable for any kidney disease or suggestion of diabetes UA completed today is unremarkable with no evidence of infection or other pathology Recommend dietary adjustments and monitoring symptoms

## 2021-06-03 NOTE — Patient Instructions (Signed)
  Medication Instructions:  Your physician recommends that you continue on your current medications as directed. Please refer to the Current Medication list given to you today. --If you need a refill on any your medications before your next appointment, please call your pharmacy first. If no refills are authorized on file call the office.-- Lab Work: Your physician has recommended that you have lab work today: Urinalysis If you have labs (blood work) drawn today and your tests are completely normal, you will receive your results via MyChart message OR a phone call from our staff.  Please ensure you check your voicemail in the event that you authorized detailed messages to be left on a delegated number. If you have any lab test that is abnormal or we need to change your treatment, we will call you to review the results.  Referrals/Procedures/Imaging: A referral has been placed for you to [facility] for evaluation and treatment. Someone from the scheduling department will be in contact with you in regards to coordinating your consultation. If you do not hear from any of the schedulers within 7-10 business days please give their office a call.  [facility] [address] [place], [place] [address] [phone]  Follow-Up: Your next appointment:   Your physician recommends that you schedule a follow-up appointment in: 3 MONTHS with Dr. de Peru  You will receive a text message or e-mail with a link to a survey about your care and experience with [place] today! We would greatly appreciate your feedback!   Thanks for letting us be apart of your health journey!!  Primary Care and Sports Medicine   Dr. Ceasar Mons Peru   We encourage you to activate your patient portal called "MyChart".  Sign up information is provided on this After Visit Summary.  MyChart is used to connect with patients for Virtual Visits (Telemedicine).  Patients are able to view lab/test  results, encounter notes, upcoming appointments, etc.  Non-urgent messages can be sent to your provider as well. To learn more about what you can do with MyChart, please visit --  [email or web address].

## 2021-06-03 NOTE — Assessment & Plan Note (Signed)
Continues to have low back pain, no significant change in symptoms Denies any new issues with radicular symptoms Given continued symptoms, will refer to physical therapy for further evaluation and management.  Patient requests referral to outside facility due to need for weekend and evening hours If not improving as expected, consider imaging

## 2021-06-03 NOTE — Assessment & Plan Note (Addendum)
The patient was counseled, risk factors were discussed, anticipatory guidance given. Given tobacco use, recommend pneumococcal vaccination, patient declines today Counseled on tobacco cessation, patient in precontemplative phase Recommend lifestyle modifications in regards to diet and exercise and working towards gradual, healthy weight loss Has not seen a dentist in at least a couple years-recommend dental visits every 6 months

## 2021-06-03 NOTE — Progress Notes (Signed)
Subjective:    CC: Annual Physical Exam  HPI:  Justin Wilson is a 40 y.o. presenting for annual physical.  Continues to have some low back pain as well as left heel pain.  He has established with a podiatrist for left heel pain. Also reporting some urinary frequency, does endorse changes to his diet which he feels may be playing a role.  Denies any associated abdominal pain or dysuria.  I reviewed the past medical history, family history, social history, surgical history, and allergies today and no changes were needed.  Please see the problem list section below in epic for further details.  Past Medical History: No past medical history on file. Past Surgical History: No past surgical history on file. Social History: Social History   Socioeconomic History   Marital status: Single    Spouse name: Not on file   Number of children: Not on file   Years of education: Not on file   Highest education level: Not on file  Occupational History   Occupation: Pensions consultant    Comment: Warehouse  Tobacco Use   Smoking status: Every Day    Packs/day: 1.00    Types: Cigarettes   Smokeless tobacco: Never  Vaping Use   Vaping Use: Never used  Substance and Sexual Activity   Alcohol use: Yes   Drug use: Yes    Types: Marijuana    Comment: recently quit about 6 months ago   Sexual activity: Yes  Other Topics Concern   Not on file  Social History Narrative   Not on file   Social Determinants of Health   Financial Resource Strain: Not on file  Food Insecurity: Not on file  Transportation Needs: Not on file  Physical Activity: Not on file  Stress: Not on file  Social Connections: Not on file   Family History: Family History  Family history unknown: Yes   Allergies: No Known Allergies Medications: See med rec.  Review of Systems: No headache, visual changes, nausea, vomiting, diarrhea, constipation, dizziness, abdominal pain, skin rash, fevers, chills, night sweats,  swollen lymph nodes, weight loss, chest pain, body aches, joint swelling, muscle aches, shortness of breath, mood changes, visual or auditory hallucinations.  Objective:    General: Well Developed, well nourished, and in no acute distress.  Neuro: Alert and oriented x3, extra-ocular muscles intact, sensation grossly intact. Cranial nerves II through XII are intact, motor, sensory, and coordinative functions are all intact. HEENT: Normocephalic, atraumatic, pupils equal round reactive to light, neck supple, no masses, no lymphadenopathy, thyroid nonpalpable. Oropharynx, nasopharynx, external ear canals are unremarkable. Skin: Warm and dry, no rashes noted.  Cardiac: Regular rate and rhythm, no murmurs rubs or gallops.  Respiratory: Clear to auscultation bilaterally. Not using accessory muscles, speaking in full sentences.  Abdominal: Soft, nontender, nondistended, positive bowel sounds, no masses, no organomegaly.  Musculoskeletal: Shoulder, elbow, wrist, hip, knee, ankle stable, and with full range of motion.  Impression and Recommendations:    Low back pain Continues to have low back pain, no significant change in symptoms Denies any new issues with radicular symptoms Given continued symptoms, will refer to physical therapy for further evaluation and management.  Patient requests referral to outside facility due to need for weekend and evening hours If not improving as expected, consider imaging  Urinary frequency Labs completed at time of last visit were unremarkable for any kidney disease or suggestion of diabetes UA completed today is unremarkable with no evidence of infection or other pathology  Recommend dietary adjustments and monitoring symptoms  Wellness examination The patient was counseled, risk factors were discussed, anticipatory guidance given. Given tobacco use, recommend pneumococcal vaccination, patient declines today Counseled on tobacco cessation, patient in  precontemplative phase Recommend lifestyle modifications in regards to diet and exercise and working towards gradual, healthy weight loss Has not seen a dentist in at least a couple years-recommend dental visits every 6 months  Plan follow-up in about 3 months to monitor blood pressure, weight, low back pain.  Consider referral to nutritionist at future visit.   ___________________________________________ Nathan Stallworth de Peru, MD, ABFM, [facility] Primary Care and Sports Medicine [facility]

## 2021-08-26 ENCOUNTER — Other Ambulatory Visit: Payer: Self-pay

## 2021-08-26 ENCOUNTER — Encounter (HOSPITAL_BASED_OUTPATIENT_CLINIC_OR_DEPARTMENT_OTHER): Payer: Self-pay | Admitting: Family Medicine

## 2021-08-26 ENCOUNTER — Ambulatory Visit (HOSPITAL_BASED_OUTPATIENT_CLINIC_OR_DEPARTMENT_OTHER): Payer: BC Managed Care – PPO | Admitting: Family Medicine

## 2021-08-26 VITALS — BP 142/104 | HR 86 | Ht 75.0 in | Wt 331.0 lb

## 2021-08-26 DIAGNOSIS — M722 Plantar fascial fibromatosis: Secondary | ICD-10-CM | POA: Diagnosis not present

## 2021-08-26 DIAGNOSIS — I10 Essential (primary) hypertension: Secondary | ICD-10-CM | POA: Diagnosis not present

## 2021-08-26 MED ORDER — AMLODIPINE BESYLATE 5 MG PO TABS: 5.0000 mg | ORAL_TABLET | Freq: Every day | ORAL | 1 refills | Status: DC

## 2021-08-26 NOTE — Assessment & Plan Note (Signed)
Continues to have elevated readings in the office, has not been checking blood pressure at home Given elevations in the office, will plan to proceed with initiation of antihypertensive Prescription sent for amlodipine 5 mg to be taken once daily Recommend intermittent monitoring of blood pressure at home, discussed reason for this Plan for follow-up in about 2 to 4 weeks to monitor blood pressure

## 2021-08-26 NOTE — Assessment & Plan Note (Addendum)
Had seen a podiatrist locally about 3 months ago, however indicates that he felt that he did not get "good service" Continues to have heel pain, varying severity during the week Discussed options with patient, would like referral to alternative podiatrist for further evaluation management, referral placed today

## 2021-08-26 NOTE — Patient Instructions (Signed)
°  Medication Instructions:  Your physician has recommended you make the following change in your medication:  --START Amlodipine 5 mg - Take 1 by mouth daily --If you need a refill on any your medications before your next appointment, please call your pharmacy first. If no refills are authorized on file call the office.--  Referrals/Procedures/Imaging: A referral has been placed for you to [facility] for evaluation and treatment. Someone from the scheduling department will be in contact with you in regards to coordinating your consultation. If you do not hear from any of the schedulers within 7-10 business days please give their office a call.  [facility] [place] [address] [place], [place] [address] Phone: [phone]  Follow-Up: Your next appointment:   Your physician recommends that you schedule a follow-up appointment in: 2-4 WEEKS with Dr. de Peru  You will receive a text message or e-mail with a link to a survey about your care and experience with [place] today! We would greatly appreciate your feedback!   Thanks for letting us be apart of your health journey!!  Primary Care and Sports Medicine   Dr. Ceasar Mons Peru   We encourage you to activate your patient portal called "MyChart".  Sign up information is provided on this After Visit Summary.  MyChart is used to connect with patients for Virtual Visits (Telemedicine).  Patients are able to view lab/test results, encounter notes, upcoming appointments, etc.  Non-urgent messages can be sent to your provider as well. To learn more about what you can do with MyChart, please visit --  [email or web address].

## 2021-08-26 NOTE — Progress Notes (Signed)
° ° °  Procedures performed today:    None.  Independent interpretation of notes and tests performed by another provider:   None.  Brief History, Exam, Impression, and Recommendations:    BP (!) 142/104    Pulse 86    Ht 6\' 3"  (1.905 m)    Wt (!) 331 lb (150.1 kg)    SpO2 97%    BMI 41.37 kg/m   Plantar fasciitis of left foot Had seen a podiatrist locally about 3 months ago, however indicates that he felt that he did not get "good service" Continues to have heel pain, varying severity during the week Discussed options with patient, would like referral to alternative podiatrist for further evaluation management, referral placed today  Hypertension Continues to have elevated readings in the office, has not been checking blood pressure at home Given elevations in the office, will plan to proceed with initiation of antihypertensive Prescription sent for amlodipine 5 mg to be taken once daily Recommend intermittent monitoring of blood pressure at home, discussed reason for this Plan for follow-up in about 2 to 4 weeks to monitor blood pressure   ___________________________________________ Arda Daggs de , MD, ABFM, [facility] Primary Care and Sports Medicine [facility]

## 2021-09-12 ENCOUNTER — Other Ambulatory Visit: Payer: Self-pay

## 2021-09-12 ENCOUNTER — Ambulatory Visit (INDEPENDENT_AMBULATORY_CARE_PROVIDER_SITE_OTHER): Payer: BC Managed Care – PPO

## 2021-09-12 ENCOUNTER — Encounter: Payer: Self-pay | Admitting: Podiatry

## 2021-09-12 ENCOUNTER — Ambulatory Visit (INDEPENDENT_AMBULATORY_CARE_PROVIDER_SITE_OTHER): Payer: Self-pay | Admitting: Podiatry

## 2021-09-12 DIAGNOSIS — M722 Plantar fascial fibromatosis: Secondary | ICD-10-CM

## 2021-09-12 MED ORDER — TRIAMCINOLONE ACETONIDE 10 MG/ML IJ SUSP
20.0000 mg | Freq: Once | INTRAMUSCULAR | Status: AC
  Administered 2021-09-12: 20 mg

## 2021-09-12 MED ORDER — DICLOFENAC SODIUM 75 MG PO TBEC: 75.0000 mg | DELAYED_RELEASE_TABLET | Freq: Two times a day (BID) | ORAL | 2 refills | Status: DC

## 2021-09-12 NOTE — Patient Instructions (Signed)

## 2021-09-12 NOTE — Progress Notes (Signed)
Subjective:   Patient ID: Justin Wilson, male   DOB: 41 y.o.   MRN: [id number]   HPI Patient presents with exquisite discomfort in the plantar heel left over right of approximate 6 months duration.  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  Patient does not active besides work   Review of Systems  All other systems reviewed and are negative.      Objective:  Physical Exam Vitals and nursing note reviewed.  Constitutional:      Appearance: He is well-developed.  Pulmonary:     Effort: Pulmonary effort is normal.  Musculoskeletal:        General: Normal range of motion.  Skin:    General: Skin is warm.  Neurological:     Mental Status: He is alert.    Neurovascular status intact muscle strength adequate range of motion adequate with patient noted to have exquisite discomfort medial fascial band left over right with inflammation fluid of the medial band.  Patient is found to have good digital perfusion well oriented moderate depression of the arch     Assessment:  Acute fasciitis left over right with fluid buildup around the medial band     Plan:  H&P x-rays reviewed sterile prep and injected the plantar fascial bilateral at insertion tendon calcaneus 3 mg Kenalog 5 mg Xylocaine and applied fascial brace left with instructions on usage placed on oral diclofenac and reappoint to recheck in 4 weeks and may require long-term orthotics  X-rays indicate small spur no indication stress fracture arthritis with mild depression of the arch

## 2021-09-20 ENCOUNTER — Ambulatory Visit (HOSPITAL_BASED_OUTPATIENT_CLINIC_OR_DEPARTMENT_OTHER): Payer: Self-pay | Admitting: Family Medicine

## 2021-09-23 ENCOUNTER — Ambulatory Visit (HOSPITAL_BASED_OUTPATIENT_CLINIC_OR_DEPARTMENT_OTHER): Payer: BC Managed Care – PPO | Admitting: Family Medicine

## 2021-10-03 ENCOUNTER — Encounter: Payer: Self-pay | Admitting: Podiatry

## 2021-10-03 ENCOUNTER — Ambulatory Visit: Payer: BC Managed Care – PPO

## 2021-10-03 ENCOUNTER — Ambulatory Visit: Payer: BC Managed Care – PPO | Admitting: Podiatry

## 2021-10-03 ENCOUNTER — Other Ambulatory Visit: Payer: Self-pay

## 2021-10-03 DIAGNOSIS — M722 Plantar fascial fibromatosis: Secondary | ICD-10-CM

## 2021-10-03 MED ORDER — TRIAMCINOLONE ACETONIDE 10 MG/ML IJ SUSP
10.0000 mg | Freq: Once | INTRAMUSCULAR | Status: AC
  Administered 2021-10-03: 10 mg

## 2021-10-03 NOTE — Progress Notes (Signed)
SITUATION Reason for Consult: Evaluation for Bilateral Custom Foot Orthoses Patient / Caregiver Report: Patient is ready for foot orthotics  OBJECTIVE DATA: Patient History / Diagnosis:    ICD-10-CM   1. Plantar fasciitis, bilateral  M72.2       Current or Previous Devices: None and no history  Foot Examination: Skin presentation:   Intact Ulcers & Callousing:   None and no history Toe / Foot Deformities:  None Weight Bearing Presentation:  Rectus Sensation:    Intact  ORTHOTIC RECOMMENDATION Recommended Device: 1x pair of custom functional foot orthotics  GOALS OF ORTHOSES - Reduce Pain - Prevent Foot Deformity - Prevent Progression of Further Foot Deformity - Relieve Pressure - Improve the Overall Biomechanical Function of the Foot and Lower Extremity.  ACTIONS PERFORMED Patient was casted for Foot Orthoses via crush box. Procedure was explained and patient tolerated procedure well. All questions were answered and concerns addressed.  PLAN Potential out of pocket cost was communicated to patient. Casts are to be sent to [facility] for fabrication. Patient is to be called for fitting when devices are ready.

## 2021-10-04 ENCOUNTER — Encounter (HOSPITAL_BASED_OUTPATIENT_CLINIC_OR_DEPARTMENT_OTHER): Payer: Self-pay | Admitting: Family Medicine

## 2021-10-06 NOTE — Progress Notes (Signed)
Subjective:   Patient ID: Justin Wilson, male   DOB: 41 y.o.   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  Patient also had castings for functional orthotics to try to reduce plantar stress on his feet

## 2021-11-14 ENCOUNTER — Ambulatory Visit (INDEPENDENT_AMBULATORY_CARE_PROVIDER_SITE_OTHER): Payer: BC Managed Care – PPO

## 2021-11-14 ENCOUNTER — Other Ambulatory Visit: Payer: Self-pay

## 2021-11-14 DIAGNOSIS — M722 Plantar fascial fibromatosis: Secondary | ICD-10-CM

## 2021-11-14 NOTE — Progress Notes (Signed)

## 2022-06-09 IMAGING — DX DG ANKLE COMPLETE 3+V*L*
4 series · 4 of 4 positions shown · non-contrast
Comparison: None.

CLINICAL DATA: 39-year-old male with left ankle and heel pain for 2
weeks with no known injury.

EXAM:
LEFT ANKLE COMPLETE - 3+ VIEW

[ankle ap]
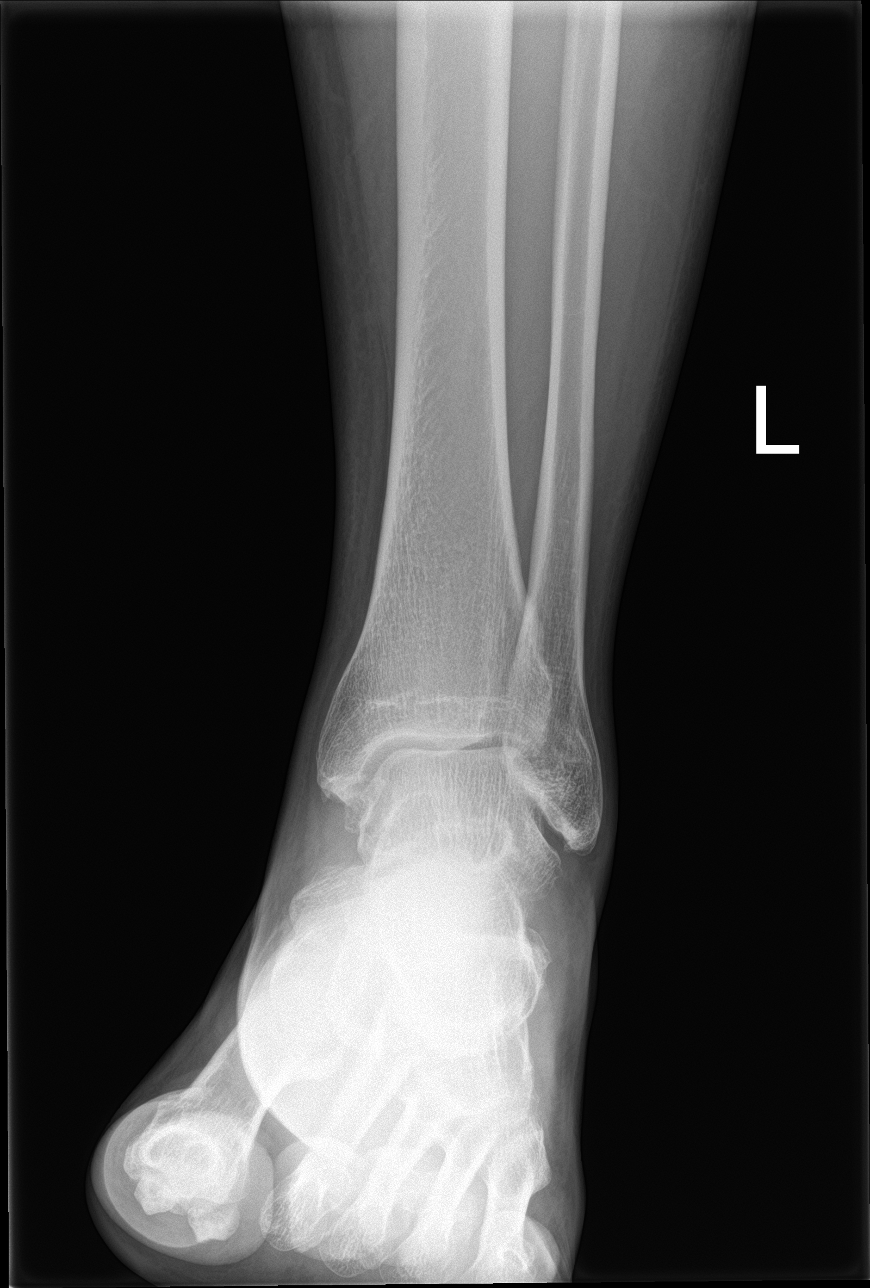

[ankle obl]
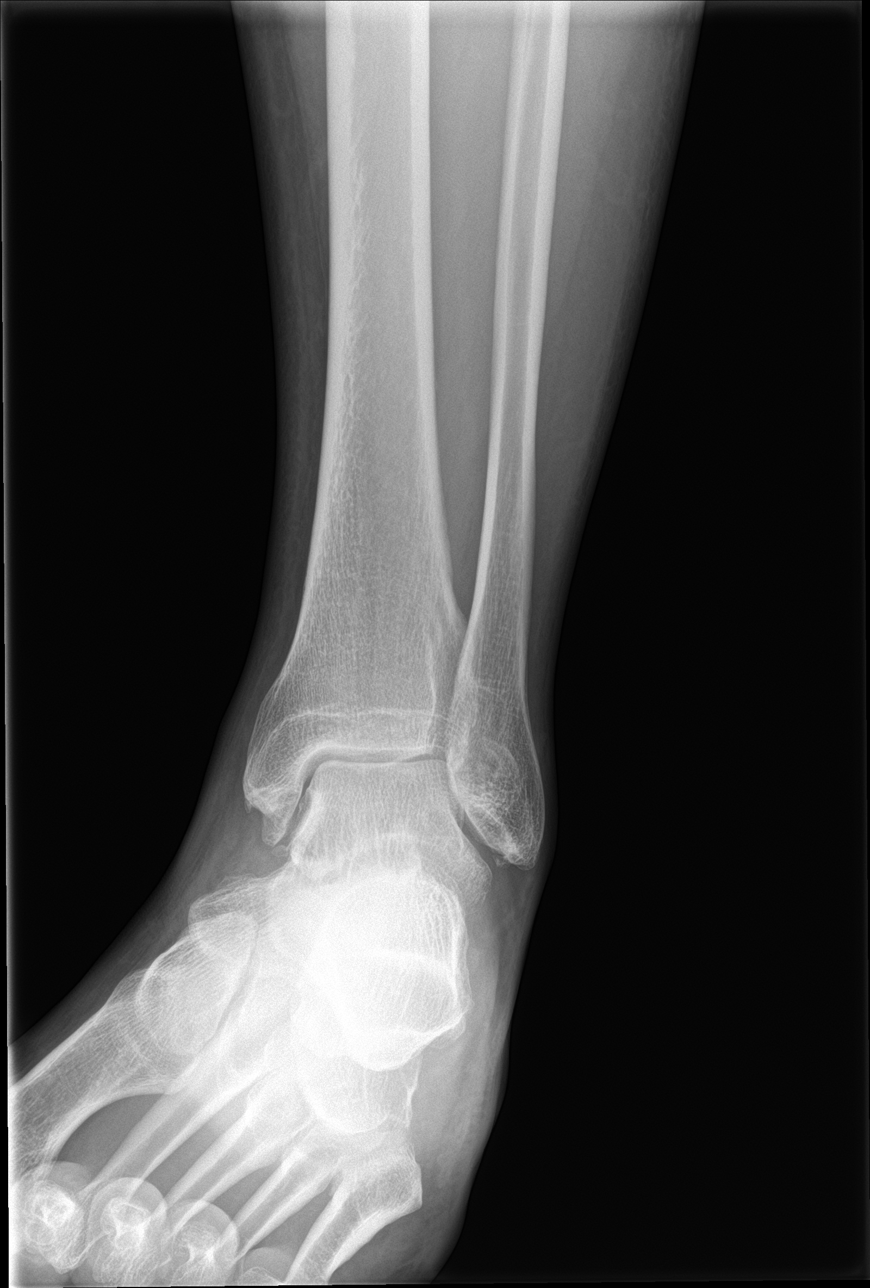

[ankle lat (1 of 2)]
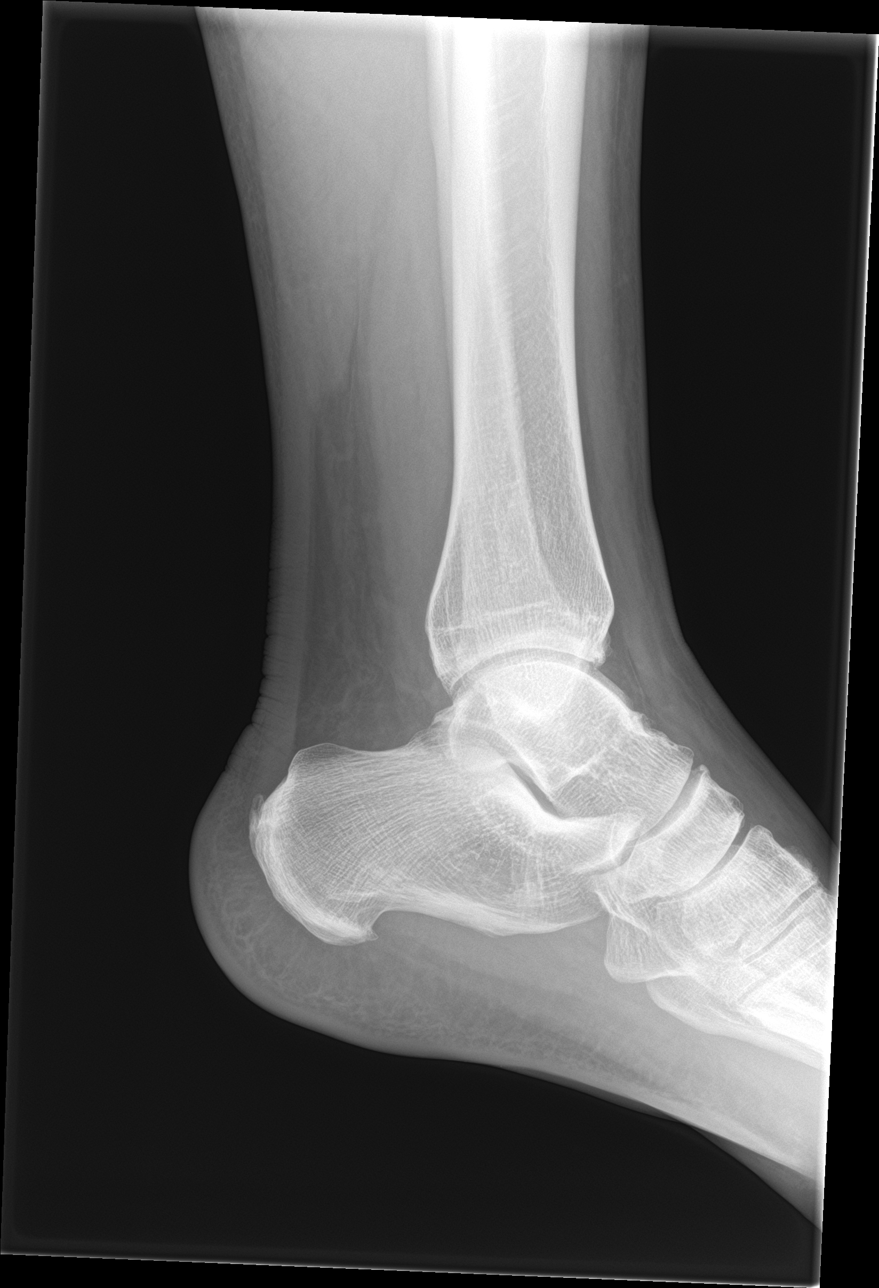

[ankle lat (2 of 2)]
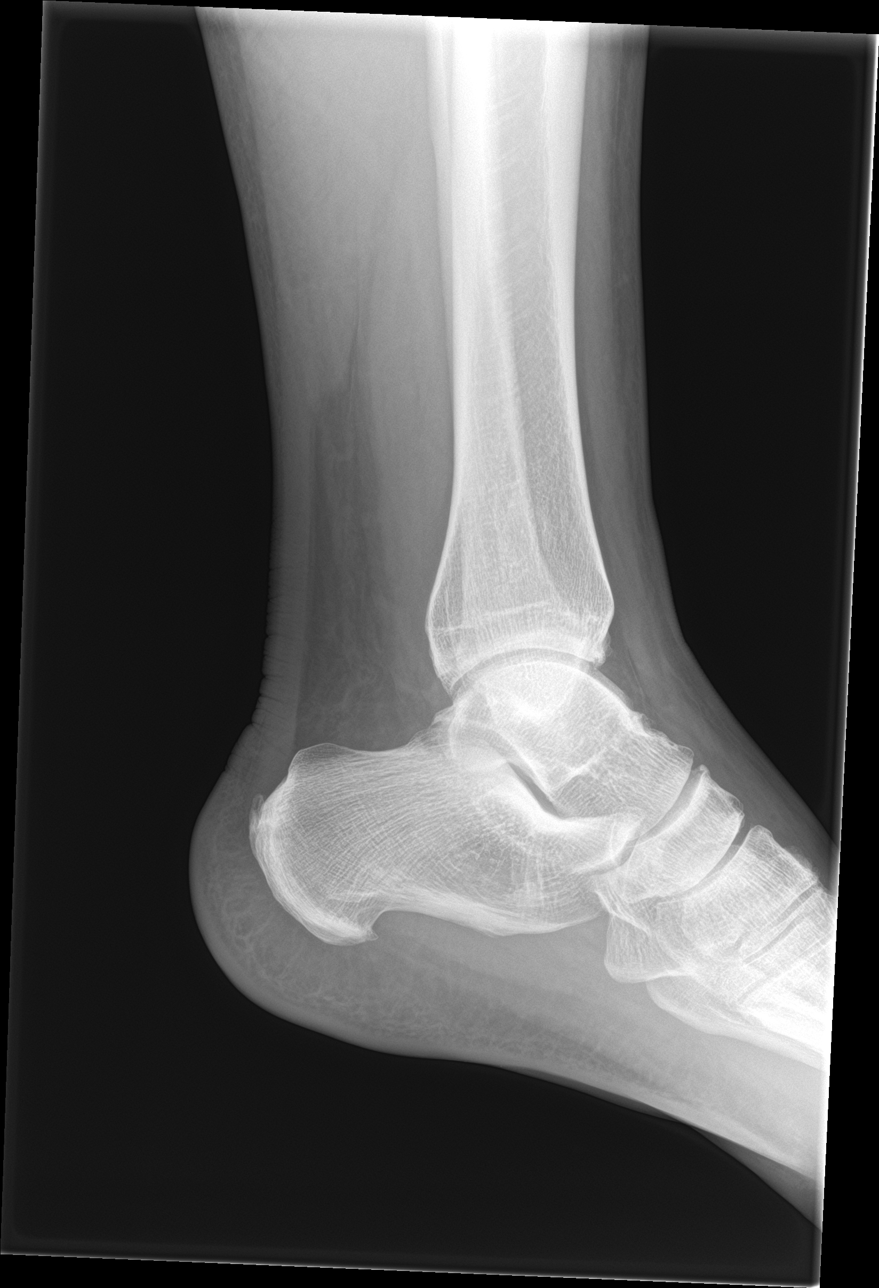

[4 of 4 positions shown; findings below may reference images not displayed]

FINDINGS: Bone mineralization is within normal limits. Mortise joint alignment
preserved. Talar dome intact. No evidence of joint effusion. Joint
space loss about the ankle with small chronic appearing ossific
fragments at both the medial and lateral malleolus. Mild
degenerative spurring including at the posterior calcaneus. No acute
osseous abnormality identified.
IMPRESSION: Chronic degenerative and/or posttraumatic changes at the ankle. No
acute osseous abnormality identified.

## 2022-12-25 ENCOUNTER — Ambulatory Visit (HOSPITAL_BASED_OUTPATIENT_CLINIC_OR_DEPARTMENT_OTHER): Payer: Medicaid Other | Admitting: Family Medicine

## 2022-12-25 ENCOUNTER — Encounter (HOSPITAL_BASED_OUTPATIENT_CLINIC_OR_DEPARTMENT_OTHER): Payer: Self-pay | Admitting: Family Medicine

## 2022-12-25 VITALS — BP 169/146 | HR 92 | Ht 75.0 in | Wt 347.7 lb

## 2022-12-25 DIAGNOSIS — K5900 Constipation, unspecified: Secondary | ICD-10-CM | POA: Diagnosis not present

## 2022-12-25 DIAGNOSIS — R81 Glycosuria: Secondary | ICD-10-CM | POA: Insufficient documentation

## 2022-12-25 DIAGNOSIS — Z0000 Encounter for general adult medical examination without abnormal findings: Secondary | ICD-10-CM | POA: Diagnosis not present

## 2022-12-25 DIAGNOSIS — I10 Essential (primary) hypertension: Secondary | ICD-10-CM

## 2022-12-25 DIAGNOSIS — R631 Polydipsia: Secondary | ICD-10-CM

## 2022-12-25 LAB — POCT URINALYSIS DIPSTICK
Bilirubin, UA: NEGATIVE
Blood, UA: NEGATIVE
Glucose, UA: NEGATIVE
Ketones, UA: NEGATIVE
Leukocytes, UA: NEGATIVE
Nitrite, UA: NEGATIVE
Protein, UA: NEGATIVE
Spec Grav, UA: >=1.03 — AB (ref 1.010–1.025)
Urobilinogen, UA: 0.2 E.U./dL
pH, UA: 5.5 (ref 5.0–8.0)

## 2022-12-25 MED ORDER — AMLODIPINE BESYLATE 5 MG PO TABS: 5.0000 mg | ORAL_TABLET | Freq: Every day | ORAL | 2 refills | Status: DC

## 2022-12-25 NOTE — Progress Notes (Signed)
Established Patient Office Visit  Subjective   Patient ID: Justin Wilson, male    DOB: 06/19/1981  Age: 42 y.o. 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  Feels thirsty at night, denies being excessively thirsty. Urinating more than normal.   HTN- Prescribed  amlodipine 08/2021 but stopped taking it  Checking it at home "on the regular"-- 120-130/70s  120/70  Issues with constipation recently  Denies blood in stool, changes in bowel habits, abdominal pain, N/V   Review of Systems  Constitutional:  Negative for chills, fever, malaise/fatigue and weight loss.  Respiratory:  Negative for cough and shortness of breath.   Cardiovascular:  Negative for chest pain and palpitations.  Gastrointestinal:  Positive for constipation. Negative for abdominal pain, nausea and vomiting.  Genitourinary:  Positive for frequency.  Neurological:  Negative for dizziness, weakness and headaches.  Endo/Heme/Allergies:  Positive for polydipsia.  Psychiatric/Behavioral:  Negative for depression, hallucinations and substance abuse. The patient is not nervous/anxious.      Objective:    BP (!) 169/146 Comment: Repeat  Pulse 92   Ht  (1.905 m)   Wt (!) 347 lb 11.2 oz (157.7 kg)   SpO2 96%   BMI 43.46 kg/m  BP Readings from Last 3 Encounters:  12/25/22 (!) 169/146  08/26/21 (!) 142/104  06/03/21 (!) 150/96     Physical Exam Constitutional:      Appearance: Normal appearance. He is obese.  Cardiovascular:     Rate and Rhythm: Normal rate and regular rhythm.     Pulses: Normal pulses.     Heart sounds: Normal heart sounds.  Pulmonary:     Effort: Pulmonary effort is normal.     Breath sounds: Normal breath sounds.  Neurological:     Mental Status: He is  alert.  Psychiatric:        Mood and Affect: Mood normal.        Behavior: Behavior normal.        Thought Content: Thought content normal.        Judgment: Judgment normal.    Assessment & Plan:   1. Increased thirst Patient reports having DOT physical performed recently and they informed him of glucosuria. He states that he had 100g of sugar in his urine. Reports polydipsia that occurs mostly at night and polyuria. Denies polyphagia. Patient well-appearing and in no acute distress. POCT urinalysis normal in office today- no glucose present in urine indicating less likely to have diabetes. Will obtain fasting glucose and hemoglobin A1c level today and follow-up based on results.  - POCT Urinalysis Dipstick - Comprehensive metabolic panel - Hemoglobin A1c  2. Primary hypertension Patient prescribed amlodipine  daily in 08/2021. He stopped taking this medication (unsure when) and reports that his blood pressure has been normal at home. Discuss concerns and complications related to high blood pressure--including heart attack and stroke. Denies chest pain, palpitations, changes in vision, shortness of breath, lower extremity edema, headaches, lightheadedness, weakness, cough. Cardiovascular exam with heart regular rate and rhythm. Normal heart sounds, no murmurs present. No lower extremity edema present. Lungs clear to auscultation bilaterally.  Patient agreeable to restart amlodipine  today. Advised him when to seek emergency care. Recommend heart healthy/Mediterranean diet, with whole grains,  fruits, vegetable, fish, lean meats, nuts, and olive oil. Limit salt. Recommend moderate walking, 3-5 times/week for 30-50 minutes each session. Aim for at least 150 minutes.week. Recommend avoidance of tobacco products. Avoid excess alcohol. Refill sent today. Plan to follow-up in 4 weeks for HTN.  -amlodipine (NORVASC)  (1 tablet) by mouth daily  3. Wellness examination Routine labs ordered.  Advised patient to schedule routine physical.  - CBC with Differential/Platelet - Comprehensive metabolic panel - Hemoglobin A1c - Lipid panel - TSH Rfx on Abnormal to Free T4  4. Constipation, unspecified  Patient reports experiencing hemorrhoids about two months ago with significant constipation. Denies current blood in stool, N/V, abdominal pain, or change in bowel habits. Advised patient to continue drinking plenty of water throughout the day and to consume high-fiber foods. Hand-out provided on constipation. Educated patient that stool softeners can be utilized to help prevent straining and, therefore, decreases likelihood of developing hemorrhoids.     Return in about 4 weeks (around 01/22/2023) for HTN follow-up.    Alyson Reedy, FNP

## 2022-12-26 ENCOUNTER — Other Ambulatory Visit (HOSPITAL_BASED_OUTPATIENT_CLINIC_OR_DEPARTMENT_OTHER): Payer: Self-pay | Admitting: Family Medicine

## 2022-12-26 DIAGNOSIS — R7303 Prediabetes: Secondary | ICD-10-CM

## 2022-12-26 LAB — HEMOGLOBIN A1C
Est. average glucose Bld gHb Est-mCnc: 131 mg/dL
Hgb A1c MFr Bld: 6.2 % — ABNORMAL HIGH (ref 4.8–5.6)

## 2022-12-26 LAB — CBC WITH DIFFERENTIAL/PLATELET
Basophils Absolute: 0.1 10*3/uL (ref 0.0–0.2)
Basos: 1 %
EOS (ABSOLUTE): 0.3 10*3/uL (ref 0.0–0.4)
Eos: 3 %
Hematocrit: 47 % (ref 37.5–51.0)
Hemoglobin: 15 g/dL (ref 13.0–17.7)
Immature Grans (Abs): 0 10*3/uL (ref 0.0–0.1)
Immature Granulocytes: 0 %
Lymphocytes Absolute: 3.2 10*3/uL — ABNORMAL HIGH (ref 0.7–3.1)
Lymphs: 26 %
MCH: 30.1 pg (ref 26.6–33.0)
MCHC: 31.9 g/dL (ref 31.5–35.7)
MCV: 94 fL (ref 79–97)
Monocytes Absolute: 0.9 10*3/uL (ref 0.1–0.9)
Monocytes: 8 %
Neutrophils Absolute: 7.6 10*3/uL — ABNORMAL HIGH (ref 1.4–7.0)
Neutrophils: 62 %
Platelets: 268 10*3/uL (ref 150–450)
RBC: 4.99 x10E6/uL (ref 4.14–5.80)
RDW: 12.6 % (ref 11.6–15.4)
WBC: 12.2 10*3/uL — ABNORMAL HIGH (ref 3.4–10.8)

## 2022-12-26 LAB — LIPID PANEL
Chol/HDL Ratio: 4.8 ratio (ref 0.0–5.0)
Cholesterol, Total: 213 mg/dL — ABNORMAL HIGH (ref 100–199)
HDL: 44 mg/dL (ref >39)
LDL Chol Calc (NIH): 141 mg/dL — ABNORMAL HIGH (ref 0–99)
Triglycerides: 154 mg/dL — ABNORMAL HIGH (ref 0–149)
VLDL Cholesterol Cal: 28 mg/dL (ref 5–40)

## 2022-12-26 LAB — TSH RFX ON ABNORMAL TO FREE T4: TSH: 3.12 u[IU]/mL (ref 0.450–4.500)

## 2022-12-26 MED ORDER — METFORMIN HCL 500 MG PO TABS: 500.0000 mg | ORAL_TABLET | Freq: Every day | ORAL | 0 refills | Status: DC

## 2023-01-22 ENCOUNTER — Encounter (HOSPITAL_BASED_OUTPATIENT_CLINIC_OR_DEPARTMENT_OTHER): Payer: Medicaid Other | Admitting: Family Medicine

## 2023-01-22 ENCOUNTER — Encounter (HOSPITAL_BASED_OUTPATIENT_CLINIC_OR_DEPARTMENT_OTHER): Payer: Self-pay

## 2023-01-26 ENCOUNTER — Encounter (HOSPITAL_BASED_OUTPATIENT_CLINIC_OR_DEPARTMENT_OTHER): Payer: Medicaid Other | Admitting: Family Medicine

## 2023-04-10 ENCOUNTER — Other Ambulatory Visit: Payer: Self-pay

## 2023-04-10 ENCOUNTER — Emergency Department (HOSPITAL_BASED_OUTPATIENT_CLINIC_OR_DEPARTMENT_OTHER): Payer: Medicaid Other

## 2023-04-10 ENCOUNTER — Encounter (HOSPITAL_BASED_OUTPATIENT_CLINIC_OR_DEPARTMENT_OTHER): Payer: Self-pay | Admitting: Emergency Medicine

## 2023-04-10 ENCOUNTER — Emergency Department (HOSPITAL_BASED_OUTPATIENT_CLINIC_OR_DEPARTMENT_OTHER)
Admission: EM | Admit: 2023-04-10 | Discharge: 2023-04-10 | Disposition: A | Discharge status: Home / Self Care | Payer: Medicaid Other | Attending: Emergency Medicine | Admitting: Emergency Medicine

## 2023-04-10 ENCOUNTER — Other Ambulatory Visit (HOSPITAL_BASED_OUTPATIENT_CLINIC_OR_DEPARTMENT_OTHER): Payer: Self-pay

## 2023-04-10 DIAGNOSIS — Z7984 Long term (current) use of oral hypoglycemic drugs: Secondary | ICD-10-CM | POA: Insufficient documentation

## 2023-04-10 DIAGNOSIS — R112 Nausea with vomiting, unspecified: Secondary | ICD-10-CM | POA: Insufficient documentation

## 2023-04-10 DIAGNOSIS — R140 Abdominal distension (gaseous): Secondary | ICD-10-CM | POA: Diagnosis not present

## 2023-04-10 DIAGNOSIS — F17200 Nicotine dependence, unspecified, uncomplicated: Secondary | ICD-10-CM | POA: Insufficient documentation

## 2023-04-10 DIAGNOSIS — R197 Diarrhea, unspecified: Secondary | ICD-10-CM | POA: Insufficient documentation

## 2023-04-10 DIAGNOSIS — R1032 Left lower quadrant pain: Secondary | ICD-10-CM | POA: Diagnosis not present

## 2023-04-10 DIAGNOSIS — Z79899 Other long term (current) drug therapy: Secondary | ICD-10-CM | POA: Diagnosis not present

## 2023-04-10 LAB — COMPREHENSIVE METABOLIC PANEL
ALT: 11 U/L (ref 0–44)
AST: 11 U/L — ABNORMAL LOW (ref 15–41)
Albumin: 3.8 g/dL (ref 3.5–5.0)
Alkaline Phosphatase: 49 U/L (ref 38–126)
Anion gap: 9 (ref 5–15)
BUN: 7 mg/dL (ref 6–20)
CO2: 24 mmol/L (ref 22–32)
Calcium: 8.5 mg/dL — ABNORMAL LOW (ref 8.9–10.3)
Chloride: 106 mmol/L (ref 98–111)
Creatinine, Ser: 0.82 mg/dL (ref 0.61–1.24)
GFR, Estimated: >60 mL/min (ref >60)
Glucose, Bld: 108 mg/dL — ABNORMAL HIGH (ref 70–99)
Potassium: 3.6 mmol/L (ref 3.5–5.1)
Sodium: 139 mmol/L (ref 135–145)
Total Bilirubin: 0.3 mg/dL (ref 0.3–1.2)
Total Protein: 7 g/dL (ref 6.5–8.1)

## 2023-04-10 LAB — URINALYSIS, ROUTINE W REFLEX MICROSCOPIC
Bilirubin Urine: NEGATIVE
Glucose, UA: NEGATIVE mg/dL
Hgb urine dipstick: NEGATIVE
Ketones, ur: NEGATIVE mg/dL
Nitrite: NEGATIVE
Specific Gravity, Urine: 1.03 (ref 1.005–1.030)
pH: 5.5 (ref 5.0–8.0)

## 2023-04-10 LAB — CBC
HCT: 45.2 % (ref 39.0–52.0)
Hemoglobin: 15.2 g/dL (ref 13.0–17.0)
MCH: 31.5 pg (ref 26.0–34.0)
MCHC: 33.6 g/dL (ref 30.0–36.0)
MCV: 93.8 fL (ref 80.0–100.0)
Platelets: 246 10*3/uL (ref 150–400)
RBC: 4.82 MIL/uL (ref 4.22–5.81)
RDW: 12.4 % (ref 11.5–15.5)
WBC: 9.3 10*3/uL (ref 4.0–10.5)
nRBC: 0 % (ref 0.0–0.2)

## 2023-04-10 LAB — LIPASE, BLOOD: Lipase: 29 U/L (ref 11–51)

## 2023-04-10 MED ORDER — HYDROMORPHONE HCL 1 MG/ML IJ SOLN
1.0000 mg | Freq: Once | INTRAMUSCULAR | Status: AC
  Administered 2023-04-10: 1 mg via INTRAVENOUS
  Filled 2023-04-10: qty 1

## 2023-04-10 MED ORDER — IOHEXOL 300 MG/ML  SOLN
100.0000 mL | Freq: Once | INTRAMUSCULAR | Status: AC | PRN
  Administered 2023-04-10: 100 mL via INTRAVENOUS

## 2023-04-10 MED ORDER — ONDANSETRON 4 MG PO TBDP
4.0000 mg | ORAL_TABLET | Freq: Three times a day (TID) | ORAL | 0 refills | Status: DC | PRN
  Filled 2023-04-10: qty 12, 4d supply, fill #0

## 2023-04-10 MED ORDER — ONDANSETRON HCL 4 MG/2ML IJ SOLN
4.0000 mg | Freq: Once | INTRAMUSCULAR | Status: AC
  Administered 2023-04-10: 4 mg via INTRAVENOUS
  Filled 2023-04-10: qty 2

## 2023-04-10 MED ORDER — SODIUM CHLORIDE 0.9 % IV BOLUS
1000.0000 mL | Freq: Once | INTRAVENOUS | Status: AC
  Administered 2023-04-10: 1000 mL via INTRAVENOUS

## 2023-04-10 MED ORDER — SODIUM CHLORIDE 0.9 % IV SOLN: INTRAVENOUS | Status: DC

## 2023-04-10 MED ORDER — LOPERAMIDE HCL 2 MG PO CAPS
2.0000 mg | ORAL_CAPSULE | Freq: Four times a day (QID) | ORAL | 0 refills | Status: DC | PRN
  Filled 2023-04-10: qty 30, 8d supply, fill #0

## 2023-04-10 NOTE — ED Notes (Signed)
Patient transported to CT 

## 2023-04-10 NOTE — ED Triage Notes (Signed)
Pt arrives pov, steady gait, c/o LUQ pain with n/v/d x 2 days

## 2023-04-10 NOTE — Discharge Instructions (Signed)
CT scan without any acute findings.  Urinalysis had some white blood cells in it so we sent it for culture.  To be notified if it grows any significant bacteria.  However there was no evidence of bacteria on the urinalysis.  Take the Zofran as needed for nausea and vomiting take the Imodium A-D as needed for diarrhea.  Work note provided.  Follow-up for any new or worse symptoms.

## 2023-04-10 NOTE — ED Provider Notes (Addendum)
[place] [facility] AT [facility] Provider Note   CSN: [id number] Arrival date & time: 04/10/23  4098     History  Chief Complaint  Patient presents with   Abdominal Pain    Justin Wilson is a 42 y.o. male.  Patient with complaint of left-sided abdominal pain for the past 1-1/2 days.  Associated with nausea vomiting diarrhea.  Initially there was some blood with the bowel movements but none since.  No vomiting today just mostly diarrhea.  No upper respiratory symptoms no fever.  Patient states he is occasionally had symptoms like this before but is never been fully evaluated.  Past medical history noncontributory patient is an everyday smoker.  No blood in the vomit.  Patient has not been on antibiotics recently.       Home Medications Prior to Admission medications   Medication Sig Start Date End Date Taking? Authorizing Provider  amLODipine (NORVASC) 5 MG tablet Take 1 tablet (5 mg total) by mouth daily. 12/25/22   Alyson Reedy, FNP  metFORMIN (GLUCOPHAGE) 500 MG tablet Take 1 tablet (500 mg total) by mouth daily with supper. 12/26/22   Alyson Reedy, FNP      Allergies    Patient has no known allergies.    Review of Systems   Review of Systems  Constitutional:  Negative for chills and fever.  HENT:  Negative for ear pain and sore throat.   Eyes:  Negative for pain and visual disturbance.  Respiratory:  Negative for cough and shortness of breath.   Cardiovascular:  Negative for chest pain and palpitations.  Gastrointestinal:  Positive for abdominal pain, diarrhea, nausea and vomiting.  Genitourinary:  Negative for dysuria and hematuria.  Musculoskeletal:  Negative for arthralgias and back pain.  Skin:  Negative for color change and rash.  Neurological:  Negative for seizures and syncope.  All other systems reviewed and are negative.   Physical Exam Updated Vital Signs BP (!) 155/95   Pulse 80   Temp 98.2 F (36.8 C) (Oral)    Resp 20   Ht 1.905 m (6\' 3" )   Wt (!) 149.7 kg   SpO2 95%   BMI 41.25 kg/m  Physical Exam Vitals and nursing note reviewed.  Constitutional:      General: He is not in acute distress.    Appearance: Normal appearance. He is well-developed. He is not ill-appearing.  HENT:     Head: Normocephalic and atraumatic.  Eyes:     Extraocular Movements: Extraocular movements intact.     Conjunctiva/sclera: Conjunctivae normal.     Pupils: Pupils are equal, round, and reactive to light.  Cardiovascular:     Rate and Rhythm: Normal rate and regular rhythm.     Heart sounds: No murmur heard. Pulmonary:     Effort: Pulmonary effort is normal. No respiratory distress.     Breath sounds: Normal breath sounds.  Abdominal:     General: There is distension.     Palpations: Abdomen is soft.     Tenderness: There is abdominal tenderness. There is no guarding.     Comments: Some mild tenderness left side of abdomen no guarding  Musculoskeletal:        General: No swelling.     Cervical back: Neck supple.  Skin:    General: Skin is warm and dry.     Capillary Refill: Capillary refill takes less than 2 seconds.  Neurological:     General: No focal deficit present.  Mental Status: He is alert and oriented to person, place, and time.  Psychiatric:        Mood and Affect: Mood normal.     ED Results / Procedures / Treatments   Labs (all labs ordered are listed, but only abnormal results are displayed) Labs Reviewed  CBC  LIPASE, BLOOD  COMPREHENSIVE METABOLIC PANEL    EKG None  Radiology No results found.  Procedures Procedures    Medications Ordered in ED Medications  0.9 %  sodium chloride infusion (has no administration in time range)  sodium chloride 0.9 % bolus 1,000 mL (has no administration in time range)  ondansetron (ZOFRAN) injection 4 mg (has no administration in time range)  HYDROmorphone (DILAUDID) injection 1 mg (has no administration in time range)    ED  Course/ Medical Decision Making/ A&P                                 Medical Decision Making Amount and/or Complexity of Data Reviewed Labs: ordered. Radiology: ordered.  Risk Prescription drug management.   Patient with left-sided abdominal pain associate with nausea vomiting and diarrhea.  Possibly could be a viral gastroenteritis but because of the tenderness to the left side of abdomen will get CT scan to rule out diverticulitis.  Location of pain seems a little atypical for peptic ulcer disease or pancreatitis.  CBC white count 9.3 not elevated so reassuring.  Hemoglobin 15.2 platelets 246.  Complete metabolic panel and lipase are pending.  Along with the CT scan patient will receive some IV fluids and some pain medicine and antinausea medicine.  Urinalysis with some white blood cells.  Rare bacteria.  RBCs only 6-10.  CT scan without any acute findings.  Will send urinalysis for culture.  Patient denies any penile discharge.  Will probably just treat symptomatically pending urinalysis.   Final Clinical Impression(s) / ED Diagnoses Final diagnoses:  Left lower quadrant abdominal pain  Nausea vomiting and diarrhea    Rx / DC Orders ED Discharge Orders     None         Vanetta Mulders, MD 04/10/23 5366    Vanetta Mulders, MD 04/10/23 1039

## 2023-04-13 ENCOUNTER — Other Ambulatory Visit (HOSPITAL_BASED_OUTPATIENT_CLINIC_OR_DEPARTMENT_OTHER): Payer: Self-pay | Admitting: Family Medicine

## 2023-04-13 MED ORDER — SULFAMETHOXAZOLE-TRIMETHOPRIM 800-160 MG PO TABS: 1.0000 | ORAL_TABLET | Freq: Two times a day (BID) | ORAL | 0 refills | Status: AC

## 2023-04-14 DIAGNOSIS — R7303 Prediabetes: Secondary | ICD-10-CM | POA: Insufficient documentation

## 2023-04-14 DIAGNOSIS — F4521 Hypochondriasis: Secondary | ICD-10-CM | POA: Insufficient documentation

## 2023-04-14 DIAGNOSIS — G8929 Other chronic pain: Secondary | ICD-10-CM | POA: Insufficient documentation

## 2023-04-15 ENCOUNTER — Encounter (HOSPITAL_BASED_OUTPATIENT_CLINIC_OR_DEPARTMENT_OTHER): Payer: Self-pay | Admitting: Family Medicine

## 2023-04-15 ENCOUNTER — Ambulatory Visit (HOSPITAL_BASED_OUTPATIENT_CLINIC_OR_DEPARTMENT_OTHER): Payer: Medicaid Other | Admitting: Family Medicine

## 2023-04-15 ENCOUNTER — Other Ambulatory Visit (HOSPITAL_BASED_OUTPATIENT_CLINIC_OR_DEPARTMENT_OTHER): Payer: Self-pay | Admitting: Family Medicine

## 2023-04-15 VITALS — BP 134/71 | HR 85 | Ht 75.0 in | Wt 356.0 lb

## 2023-04-15 DIAGNOSIS — R350 Frequency of micturition: Secondary | ICD-10-CM

## 2023-04-15 DIAGNOSIS — R1012 Left upper quadrant pain: Secondary | ICD-10-CM | POA: Diagnosis not present

## 2023-04-15 DIAGNOSIS — R7303 Prediabetes: Secondary | ICD-10-CM

## 2023-04-15 LAB — POCT URINALYSIS DIPSTICK
Bilirubin, UA: NEGATIVE
Glucose, UA: NEGATIVE
Ketones, UA: NEGATIVE
Nitrite, UA: NEGATIVE
Protein, UA: POSITIVE — AB
Spec Grav, UA: >=1.03 — AB (ref 1.010–1.025)
Urobilinogen, UA: 0.2 E.U./dL
pH, UA: 6 (ref 5.0–8.0)

## 2023-04-15 LAB — POCT GLUCOSE (DEVICE FOR HOME USE): Glucose Fasting, POC: 136 mg/dL — AB (ref 70–99)

## 2023-04-15 MED ORDER — METFORMIN HCL 500 MG PO TABS
500.0000 mg | ORAL_TABLET | Freq: Every day | ORAL | 0 refills | Status: DC
Start: 2023-04-15 — End: 2023-05-12

## 2023-04-15 NOTE — Progress Notes (Unsigned)
   Established Patient Office Visit  Subjective   Patient ID: Justin Wilson, male    DOB: 05/24/1981  Age: 42 y.o. MRN: [id number]  Chief Complaint  Patient presents with   Abdominal Pain    Left side, ongoing for about a week. Has some burning with urination   Justin Wilson is a 42 year-old male patient who presents today with urinary symptoms and complaint with left-sided abdominal pain since last Tuesday (04/07/2023). He was seen at [facility] on 8/2 and was seen for left-sided abdominal pain associated with N/V/D. Vomiting and diarrhea have improved but he is still experiencing diarrhea. Reports urinary frequency- he reports waking up three times during the night. He stops drinking fluids after he eats dinner.   L-sided abd pain: reports it is a brief (less than 60 seconds) and a sharp pain. Reports he has watery diarrhea that is not normal for him- "it has been leaking." He reports yesterday he noticed having blood in the toilet after having BM. Reports urinary frequency, slight dysuria/discomfort. Denies visible hematuria. Denies fever/chills, body aches, upper respiratory symptoms, vomiting, constipation.   Started taking Bactrim on 8/5 for UTI from urine culture at the ED. Reports urinary symptoms are improving.   Review of Systems  Constitutional:  Negative for chills, fever and malaise/fatigue.  Respiratory:  Negative for cough and shortness of breath.   Cardiovascular:  Negative for chest pain, palpitations and leg swelling.  Gastrointestinal:  Positive for blood in stool and diarrhea. Negative for heartburn, nausea and vomiting.  Genitourinary:  Positive for dysuria, flank pain and urgency. Negative for frequency and hematuria.  Musculoskeletal:  Negative for myalgias.  Neurological:  Negative for dizziness and headaches.    Objective:    BP 134/71   Ht 6\' 3"  (1.905 m)   Wt (!) 356 lb (161.5 kg)   BMI 44.50 kg/m  BP Readings from Last 3 Encounters:  04/15/23 134/71   04/10/23 (!) 152/97  12/25/22 (!) 169/146    Physical Exam Constitutional:      Appearance: Normal appearance.  Cardiovascular:     Rate and Rhythm: Normal rate and regular rhythm.     Heart sounds: Normal heart sounds.  Pulmonary:     Effort: Pulmonary effort is normal.     Breath sounds: Normal breath sounds.  Abdominal:     General: Bowel sounds are normal. There is no distension.     Palpations: Abdomen is soft.     Tenderness: There is abdominal tenderness in the left upper quadrant. There is left CVA tenderness. There is no right CVA tenderness.  Neurological:     Mental Status: He is alert.  Psychiatric:        Mood and Affect: Mood normal.        Behavior: Behavior normal.     Assessment & Plan:   1. Urine frequency Patient presents today with urinary frequency. He was seen at [facility] ED on 8/2 for left-sided abdominal pain and dysuria. CT scan done on Started on antibiotics on 8/5 due to continued symptoms.  - POCT Urinalysis Dipstick - POCT Glucose (Device for Home Use) - Urine Culture  2. LUQ abdominal pain Patient presents today for LUQ abdominal pain with urinary frequency.  - Hepatic function panel - CBC with Differential/Platelet - Lipase - Urine Culture   No follow-ups on file.    Justin Reedy, FNP

## 2023-04-16 ENCOUNTER — Encounter (HOSPITAL_BASED_OUTPATIENT_CLINIC_OR_DEPARTMENT_OTHER): Payer: Self-pay

## 2023-04-22 ENCOUNTER — Encounter (HOSPITAL_BASED_OUTPATIENT_CLINIC_OR_DEPARTMENT_OTHER): Payer: Self-pay | Admitting: Family Medicine

## 2023-04-22 ENCOUNTER — Ambulatory Visit (HOSPITAL_BASED_OUTPATIENT_CLINIC_OR_DEPARTMENT_OTHER): Payer: Medicaid Other | Admitting: Family Medicine

## 2023-04-22 VITALS — BP 146/93 | HR 99 | Ht 75.0 in | Wt 350.0 lb

## 2023-04-22 DIAGNOSIS — R350 Frequency of micturition: Secondary | ICD-10-CM | POA: Diagnosis not present

## 2023-04-22 DIAGNOSIS — I10 Essential (primary) hypertension: Secondary | ICD-10-CM

## 2023-04-22 LAB — POCT URINALYSIS DIP (CLINITEK)
Bilirubin, UA: NEGATIVE
Blood, UA: NEGATIVE
Glucose, UA: NEGATIVE mg/dL
Ketones, POC UA: NEGATIVE mg/dL
Nitrite, UA: NEGATIVE
POC PROTEIN,UA: NEGATIVE
Spec Grav, UA: 1.025 (ref 1.010–1.025)
Urobilinogen, UA: 0.2 E.U./dL
pH, UA: 6 (ref 5.0–8.0)

## 2023-04-22 MED ORDER — AMLODIPINE BESYLATE 5 MG PO TABS
7.5000 mg | ORAL_TABLET | Freq: Every day | ORAL | 2 refills | Status: DC
Start: 2023-04-22 — End: 2024-02-18

## 2023-04-22 NOTE — Progress Notes (Signed)
Established Patient Office Visit  Subjective  Patient ID: Justin Wilson, male    DOB: 1981/05/24  Age: 42 y.o. MRN: [id number]  Chief Complaint  Patient presents with   Urinary Frequency    Pt states he is still having problems with urinary frequency. States the pain that he was having in his side is not as bad as it was.   Justin Wilson is a 42 year-old male patient who is following up today for urinary frequency. Last office visit on 04/15/2023- UA with presence of leukocytes and protein along with dysuria, urinary urgency and frequency. Patient completed Bactrim on 04/20/2023. He reports symptoms have improved but he is still having urinary frequency. He reports that he is occasionally constipated. 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 Denies STI exposure, penile discharge, scrotal tenderness/swelling/pain.   IPSS Questionnaire (AUA-7): Over the past month.   1)  How often have you had a sensation of not emptying your bladder completely after you finish urinating?  2 - Less than half the time  2)  How often have you had to urinate again less than two hours after you finished urinating? 4 - More than half the time  3)  How often have you found you stopped and started again several times when you urinated?  3 - About half the time  4) How difficult have you found it to postpone urination?  5 - Almost always  5) How often have you had a weak urinary stream?  3 - About half the time  6) How often have you had to push or strain to begin urination?  0 - Not at all  7) How many times did you most typically get up to urinate from the time you went to bed until the time you got up in the morning?  3 - 3 times  Total score:  0-7 mildly symptomatic   8-19 moderately symptomatic   20-35 severely symptomatic     Review of Systems  Constitutional:  Negative for chills, fever and weight loss.  Respiratory:   Negative for cough and shortness of breath.   Cardiovascular:  Negative for chest pain, palpitations and leg swelling.  Gastrointestinal:  Positive for abdominal pain and constipation. Negative for nausea and vomiting.  Genitourinary:  Positive for frequency and urgency. Negative for dysuria, flank pain and hematuria.  Musculoskeletal:  Negative for back pain.  Skin:  Negative for rash.  Neurological:  Negative for weakness and headaches.    Objective:    BP (!) 146/93   Pulse 99   Ht 6\' 3"  (1.905 m)   Wt (!) 350 lb (158.8 kg)   SpO2 96%   BMI 43.75 kg/m  BP Readings from Last 3 Encounters:  04/22/23 (!) 146/93  04/15/23 134/71  04/10/23 (!) 152/97     Physical Exam Constitutional:      Appearance: Normal appearance.  Cardiovascular:     Rate and Rhythm: Normal rate and regular rhythm.     Pulses: Normal pulses.     Heart sounds: Normal heart sounds.  Pulmonary:     Effort: Pulmonary effort is normal.     Breath sounds: Normal breath sounds.  Abdominal:     General: Bowel sounds are normal.     Palpations: Abdomen is soft.     Tenderness: There is no abdominal tenderness. There is no right CVA tenderness, left CVA  tenderness, guarding or rebound.  Neurological:     Mental Status: He is alert.  Psychiatric:        Mood and Affect: Mood normal.        Behavior: Behavior normal.       Assessment & Plan:  1. Urine frequency Patient presents today with continued urinary frequency. POCT UA repeated today with no hematuria present and trace leukocytes. Patient reports he has not been sexually active recently and is not concerned about STI exposure. Denies CVA tenderness, N/V, fever/chills, abdominal pain, testicular or penile pain, and penile discharge. Patient recently mentioned urinary frequency at office visit in April 2024. Counseled patient about treating constipation and to follow-up if use of stool softeners did not improve urinary frequency. IPSS completed today  with score of 20, indicating severely symptomatic. Urgent referral placed to urology.  - POCT URINALYSIS DIP (CLINITEK) - Ambulatory referral to Urology  2. Primary hypertension Patient presents today with initial elevated blood pressure and elevated with repeat. Patient in no acute distress and is well-appearing. Denies chest pain, shortness of breath, lower extremity edema, vision changes, headaches. Cardiovascular exam with heart regular rate and rhythm. Normal heart sounds, no murmurs present. No lower extremity edema present. Lungs clear to auscultation bilaterally. Patient is currently taking amlodipine 5mg  daily. Discussed increasing amlodipine to 7.5mg  daily. Refills provided today. Advised patient to monitor blood pressure as we change his dose. Follow-up in 4-6 weeks.     - amLODipine (NORVASC) 5 MG tablet; Take 1.5 tablets (7.5 mg total) by mouth daily.  Dispense: 30 tablet; Refill: 2   Return in about 6 weeks (around 06/03/2023) for HTN follow-up.    Alyson Reedy, FNP

## 2023-05-06 ENCOUNTER — Ambulatory Visit: Payer: Medicaid Other | Admitting: Urology

## 2023-05-06 ENCOUNTER — Encounter: Payer: Self-pay | Admitting: Urology

## 2023-05-06 VITALS — BP 152/88 | HR 109 | Ht 75.0 in | Wt 343.0 lb

## 2023-05-06 DIAGNOSIS — R399 Unspecified symptoms and signs involving the genitourinary system: Secondary | ICD-10-CM

## 2023-05-06 LAB — BLADDER SCAN AMB NON-IMAGING

## 2023-05-06 MED ORDER — TAMSULOSIN HCL 0.4 MG PO CAPS
0.4000 mg | ORAL_CAPSULE | Freq: Every day | ORAL | 3 refills | Status: DC
Start: 2023-05-06 — End: 2024-03-08

## 2023-05-06 NOTE — Progress Notes (Signed)
   Assessment: 1. Lower urinary tract symptoms (LUTS)      Plan: Today I had a long discussion with the patient regarding his lower urinary tract symptoms.  I recommend that he begin a bowel regimen to prevent significant constipation which may impact his voiding status. I have also recommended a trial of medical management with tamsulosin 0.4 mg daily. Nature of medication including proper utilization as well as potential adverse events and side effects reviewed.  Follow-up 4 months for recheck.  If not significantly improved with consider cystoscopy at that time.  Chief Complaint: LUTS  History of Present Illness:  Justin Wilson is a 42 y.o. male with a past medical history of severe obesity, type 2 diabetes, hypertension, who is seen in consultation from de [place], Buren Kos, MD for evaluation of lower urinary tract symptoms. No prior urologic history. Patient reports progressive lower urinary tract symptoms over the past year. IPSS = 19.  PVR today = 0 mL       Past Medical History:  No past medical history on file.  Past Surgical History:  No past surgical history on file.  Allergies:  No Known Allergies  Family History:  Family History  Problem Relation Age of Onset   Benign prostatic hyperplasia Maternal Grandfather        "he had something with his prostate"    Social History:  Social History   Tobacco Use   Smoking status: Every Day    Current packs/day: 1.00    Types: Cigarettes   Smokeless tobacco: Never  Vaping Use   Vaping status: Never Used  Substance Use Topics   Alcohol use: Not Currently   Drug use: Not Currently    Types: Marijuana    Comment: recently quit about 6 months ago    Review of symptoms:  Constitutional:  Negative for unexplained weight loss, night sweats, fever, chills ENT:  Negative for nose bleeds, sinus pain, painful swallowing CV:  Negative for chest pain, shortness of breath, exercise intolerance, palpitations,  loss of consciousness Resp:  Negative for cough, wheezing, shortness of breath GI:  Negative for nausea, vomiting, diarrhea, bloody stools GU:  Positives noted in HPI; otherwise negative for gross hematuria, dysuria, urinary incontinence Neuro:  Negative for seizures, poor balance, limb weakness, slurred speech Psych:  Negative for lack of energy, depression, anxiety Endocrine:  Negative for polydipsia, polyuria, symptoms of hypoglycemia (dizziness, hunger, sweating) Hematologic:  Negative for anemia, purpura, petechia, prolonged or excessive bleeding, use of anticoagulants  Allergic:  Negative for difficulty breathing or choking as a result of exposure to anything; no shellfish allergy; no allergic response (rash/itch) to materials, foods  Physical exam: BP (!) 152/88   Pulse (!) 109   Ht 6\' 3"  (1.905 m)   Wt (!) 343 lb (155.6 kg)   BMI 42.87 kg/m  GENERAL APPEARANCE:  Well appearing, well developed, well nourished, NAD HEENT: Atraumatic, Normocephalic, oropharynx clear.   GU: Normal external genitalia DRE: Normal sphincter tone; prostate is very difficult to feel due to the patient's body habitus.  I can only feel the apex which is palpably normal.  Results: UA neg for blood or infection

## 2023-05-06 NOTE — Addendum Note (Signed)
Addended by: Carolin Coy on: 05/06/2023 01:27 PM   Modules accepted: Orders

## 2023-05-08 ENCOUNTER — Other Ambulatory Visit (HOSPITAL_BASED_OUTPATIENT_CLINIC_OR_DEPARTMENT_OTHER): Payer: Self-pay | Admitting: Family Medicine

## 2023-05-08 DIAGNOSIS — R7303 Prediabetes: Secondary | ICD-10-CM

## 2023-05-15 LAB — URINALYSIS, ROUTINE W REFLEX MICROSCOPIC
Bilirubin, UA: NEGATIVE
Glucose, UA: NEGATIVE
Leukocytes,UA: NEGATIVE
Nitrite, UA: NEGATIVE
RBC, UA: NEGATIVE
Specific Gravity, UA: >1.03 — ABNORMAL HIGH (ref 1.005–1.030)
Urobilinogen, Ur: 1 mg/dL (ref 0.2–1.0)
pH, UA: 5.5 (ref 5.0–7.5)

## 2023-05-27 ENCOUNTER — Encounter: Payer: Self-pay | Admitting: Pharmacist

## 2023-06-04 ENCOUNTER — Ambulatory Visit (HOSPITAL_BASED_OUTPATIENT_CLINIC_OR_DEPARTMENT_OTHER): Payer: Medicaid Other | Admitting: Family Medicine

## 2023-06-04 ENCOUNTER — Encounter (HOSPITAL_BASED_OUTPATIENT_CLINIC_OR_DEPARTMENT_OTHER): Payer: Self-pay | Admitting: Family Medicine

## 2023-06-04 VITALS — BP 148/112 | HR 94 | Ht 75.0 in | Wt 349.5 lb

## 2023-06-04 DIAGNOSIS — I10 Essential (primary) hypertension: Secondary | ICD-10-CM

## 2023-06-04 DIAGNOSIS — R7303 Prediabetes: Secondary | ICD-10-CM

## 2023-06-04 LAB — POCT GLYCOSYLATED HEMOGLOBIN (HGB A1C)
HbA1c, POC (controlled diabetic range): 5.6 % (ref 0.0–7.0)
Hemoglobin A1C: 5.6 % (ref 4.0–5.6)

## 2023-06-04 NOTE — Assessment & Plan Note (Addendum)
Recommend gradual increase in weekly physical activity Consider referral to nutritionist He had seen provider in [place] who had discussed GLP-1 receptor agonist during their office visit.  We did review this medication class and occasional difficulty with insurance coverage. We will check hemoglobin A1c as above and proceed with we can prescribe GLP-1 receptor agonist either to assist with weight loss or for treatment of diabetes if hemoglobin A1c has further increased given prior elevation.  Did discuss that this may not be covered well by insurance and/or cost prohibitive.  We also reviewed potential side effects/adverse reactions should patient be able to start medication

## 2023-06-04 NOTE — Progress Notes (Signed)
    Procedures performed today:    None.  Independent interpretation of notes and tests performed by another provider:   None.  Brief History, Exam, Impression, and Recommendations:    BP (!) 148/112 (BP Location: Right Arm, Patient Position: Sitting, Cuff Size: Normal)   Pulse 94   Ht 6\' 3"  (1.905 m)   Wt (!) 349 lb 8 oz (158.5 kg)   SpO2 96%   BMI 43.68 kg/m   Primary hypertension Assessment & Plan: Blood pressure elevated in office, remains elevated recheck.  He is not checking blood pressure at home.  No current issues with chest pain or headaches.  He is prescribed amlodipine, was taking regularly previously, however has not been taking regularly over the past 3 weeks. We discussed considerations today.  Given that he has been inconsistent with current medication regimen, we will not make any changes today.  Encouraged to continue with medications as prescribed and we will plan for close follow-up in about 3 to 4 weeks to monitor blood pressure when taking medication regularly. If blood pressure continues to be elevated, likely would proceed with initiation of valsartan at low-dose   Pre-diabetes Assessment & Plan: Has had elevated hemoglobin A1c in the past, last checked about 5 months ago, found to be 6.2% at that time.  He was started on metformin, has been taking as prescribed, 500 mg once daily.  Did initially have some GI upset which has largely subsided for him. Due for recheck of A1c today, we will proceed with this  Orders: -     POCT glycosylated hemoglobin (Hb A1C)  Severe obesity (BMI >= 40) (HCC) Assessment & Plan: Recommend gradual increase in weekly physical activity Consider referral to nutritionist He had seen provider in [place] who had discussed GLP-1 receptor agonist during their office visit.  We did review this medication class and occasional difficulty with insurance coverage. We will check hemoglobin A1c as above and proceed with we can  prescribe GLP-1 receptor agonist either to assist with weight loss or for treatment of diabetes if hemoglobin A1c has further increased given prior elevation.  Did discuss that this may not be covered well by insurance and/or cost prohibitive.  We also reviewed potential side effects/adverse reactions should patient be able to start medication   Return in about 3 weeks (around 06/25/2023) for hypertension, blood pressure.   ___________________________________________ Justin Whittle de [place], MD, ABFM, [facility]

## 2023-06-04 NOTE — Assessment & Plan Note (Signed)
Blood pressure elevated in office, remains elevated recheck.  He is not checking blood pressure at home.  No current issues with chest pain or headaches.  He is prescribed amlodipine, was taking regularly previously, however has not been taking regularly over the past 3 weeks. We discussed considerations today.  Given that he has been inconsistent with current medication regimen, we will not make any changes today.  Encouraged to continue with medications as prescribed and we will plan for close follow-up in about 3 to 4 weeks to monitor blood pressure when taking medication regularly. If blood pressure continues to be elevated, likely would proceed with initiation of valsartan at low-dose

## 2023-06-04 NOTE — Assessment & Plan Note (Signed)
Has had elevated hemoglobin A1c in the past, last checked about 5 months ago, found to be 6.2% at that time.  He was started on metformin, has been taking as prescribed, 500 mg once daily.  Did initially have some GI upset which has largely subsided for him. Due for recheck of A1c today, we will proceed with this

## 2023-06-24 ENCOUNTER — Ambulatory Visit (HOSPITAL_BASED_OUTPATIENT_CLINIC_OR_DEPARTMENT_OTHER): Payer: Medicaid Other | Admitting: Family Medicine

## 2023-07-30 ENCOUNTER — Encounter (HOSPITAL_BASED_OUTPATIENT_CLINIC_OR_DEPARTMENT_OTHER): Payer: Self-pay | Admitting: Family Medicine

## 2023-09-10 ENCOUNTER — Ambulatory Visit: Payer: Medicaid Other | Admitting: Urology

## 2023-10-13 ENCOUNTER — Ambulatory Visit (HOSPITAL_COMMUNITY)
Admission: EM | Admit: 2023-10-13 | Discharge: 2023-10-13 | Disposition: A | Discharge status: Home / Self Care | Payer: Medicaid Other | Attending: Physician Assistant | Admitting: Physician Assistant

## 2023-10-13 ENCOUNTER — Encounter (HOSPITAL_COMMUNITY): Payer: Self-pay | Admitting: *Deleted

## 2023-10-13 DIAGNOSIS — U071 COVID-19: Secondary | ICD-10-CM | POA: Diagnosis present

## 2023-10-13 HISTORY — DX: Essential (primary) hypertension: I10

## 2023-10-13 NOTE — Discharge Instructions (Signed)
 You have tested positive for COVID with a home test.   The goal of treatment at this time is to reduce your symptoms and discomfort   I recommend using Robitussin and Mucinex (regular formulations, nothing with decongestants or DM)  You can also use Tylenol  for body aches and fever reduction I also recommend adding an antihistamine to your daily regimen This includes medications like Claritin, Allegra, Zyrtec- the generics of these work very well and are usually less expensive I recommend using Flonase nasal spray - 2 puffs twice per day to help with your nasal congestion The antihistamines and Flonase can take a few weeks to provide significant relief from allergy  symptoms but should start to provide some benefit soon. You can use a humidifier at night to help with preventing nasal dryness and irritation   If your symptoms do not improve or become worse in the next 5-7 days please make an apt at the office so we can see you  Go to the ER if you begin to have more serious symptoms such as shortness of breath, trouble breathing, loss of consciousness, swelling around the eyes, high fever, severe lasting headaches, vision changes or neck pain/stiffness.

## 2023-10-13 NOTE — ED Provider Notes (Signed)
 [facility]    CSN: [id number] Arrival date & time: 10/13/23  0809      History   Chief Complaint Chief Complaint  Patient presents with   Covid Positive   Generalized Body Aches   Headache    HPI Justin Wilson is a 43 y.o. male.   HPI   He reports he has been feeling bad for about a week but symptoms got worse yesterday  He has tested positive for COVID with home test  He reports coughing is usually worse at night  Interventions: Advil and Ibuprofen   He states he has consumed 3 energy drinks to get through the work day yesterday     Past Medical History:  Diagnosis Date   Hypertension     Patient Active Problem List   Diagnosis Date Noted   Chronic pain of both knees 04/14/2023   Hypochondriacal pain 04/14/2023   Pre-diabetes 04/14/2023   Glucosuria 12/25/2022   Constipation 12/25/2022   Increased thirst 12/25/2022   Hypertension 08/26/2021   Wellness examination 06/03/2021   Urinary frequency 06/03/2021   Elevated blood pressure reading without diagnosis of hypertension 04/18/2021   Severe obesity (BMI >= 40) (HCC) 04/18/2021   Plantar fasciitis of left foot 04/18/2021   Low back pain 04/18/2021    History reviewed. No pertinent surgical history.     Home Medications    Prior to Admission medications   Medication Sig Start Date End Date Taking? Authorizing Provider  amLODipine  (NORVASC ) 5 MG tablet Take 1.5 tablets (7.5 mg total) by mouth daily. 04/22/23   Towana Small, FNP  loperamide  (IMODIUM ) 2 MG capsule Take 1 capsule (2 mg total) by mouth 4 (four) times daily as needed for diarrhea or loose stools. 04/10/23   Zackowski, Scott, MD  metFORMIN  (GLUCOPHAGE ) 500 MG tablet TAKE 1 TABLET BY MOUTH DAILY WITH SUPPER. 05/12/23   Towana Small, FNP  ondansetron  (ZOFRAN -ODT) 4 MG disintegrating tablet Take 1 tablet (4 mg total) by mouth every 8 (eight) hours as needed for nausea or vomiting. 04/10/23   Zackowski, Scott, MD  tamsulosin   (FLOMAX ) 0.4 MG CAPS capsule Take 1 capsule (0.4 mg total) by mouth daily after supper. Patient not taking: Reported on 06/04/2023 05/06/23   Shona Layman BROCKS, MD    Family History Family History  Problem Relation Age of Onset   Benign prostatic hyperplasia Maternal Grandfather        he had something with his prostate    Social History Social History   Tobacco Use   Smoking status: Every Day    Current packs/day: 1.00    Types: Cigarettes   Smokeless tobacco: Never  Vaping Use   Vaping status: Never Used  Substance Use Topics   Alcohol use: Not Currently   Drug use: Not Currently    Types: Marijuana    Comment: recently quit about 6 months ago     Allergies   Patient has no known allergies.   Review of Systems Review of Systems  Constitutional:  Positive for chills, fatigue and fever.  HENT:  Positive for congestion, sinus pressure, sinus pain and sore throat. Negative for ear pain.   Respiratory:  Positive for cough and wheezing. Negative for shortness of breath.   Musculoskeletal:  Positive for arthralgias and myalgias.  Neurological:  Positive for headaches.     Physical Exam Triage Vital Signs ED Triage Vitals [10/13/23 0840]  Encounter Vitals Group     BP (!) 148/90     Systolic  BP Percentile      Diastolic BP Percentile      Pulse Rate (!) 107     Resp 20     Temp 100 F (37.8 C)     Temp Source Oral     SpO2 94 %     Weight      Height      Head Circumference      Peak Flow      Pain Score      Pain Loc      Pain Education      Exclude from Growth Chart    No data found.  Updated Vital Signs BP (!) 148/90 (BP Location: Right Arm)   Pulse (!) 107   Temp 100 F (37.8 C) (Oral)   Resp 20   SpO2 94%   Visual Acuity Right Eye Distance:   Left Eye Distance:   Bilateral Distance:    Right Eye Near:   Left Eye Near:    Bilateral Near:     Physical Exam Vitals reviewed.  Constitutional:      General: He is awake.     Appearance:  Normal appearance. He is well-developed and well-groomed. He is ill-appearing.  HENT:     Head: Normocephalic and atraumatic.     Right Ear: Hearing, tympanic membrane and ear canal normal.     Left Ear: Hearing, tympanic membrane and ear canal normal.     Mouth/Throat:     Lips: Pink.     Mouth: Mucous membranes are moist.     Pharynx: Oropharynx is clear. Uvula midline. No pharyngeal swelling, oropharyngeal exudate, posterior oropharyngeal erythema, uvula swelling or postnasal drip.  Cardiovascular:     Rate and Rhythm: Normal rate and regular rhythm.     Heart sounds: Normal heart sounds. No murmur heard.    No friction rub. No gallop.  Pulmonary:     Effort: Pulmonary effort is normal.     Breath sounds: Normal breath sounds. No decreased air movement. No decreased breath sounds, wheezing, rhonchi or rales.  Lymphadenopathy:     Head:     Right side of head: No submental, submandibular or preauricular adenopathy.     Left side of head: Submandibular adenopathy present. No submental or preauricular adenopathy.     Cervical:     Right cervical: No superficial cervical adenopathy.    Left cervical: No superficial cervical adenopathy.     Upper Body:     Right upper body: No supraclavicular adenopathy.     Left upper body: No supraclavicular adenopathy.  Neurological:     Mental Status: He is alert.  Psychiatric:        Attention and Perception: Attention normal.        Mood and Affect: Mood normal.        Speech: Speech normal.        Behavior: Behavior normal. Behavior is cooperative.      UC Treatments / Results  Labs (all labs ordered are listed, but only abnormal results are displayed) Labs Reviewed - No data to display  EKG   Radiology No results found.  Procedures Procedures (including critical care time)  Medications Ordered in UC Medications - No data to display  Initial Impression / Assessment and Plan / UC Course  I have reviewed the triage vital  signs and the nursing notes.  Pertinent labs & imaging results that were available during my care of the patient were reviewed by me and considered in  my medical decision making (see chart for details).     Patient is tested positive for COVID-19 with a home test.  He reports that he has been having symptoms for the past week comprised mainly of nasal congestion, chills, body aches, headaches.  At this time he is outside the window for COVID antiviral medications.  Reviewed over-the-counter symptomatic management during appointment.  Reviewed ED and return precautions follow-up as needed for progressing or persistent symptoms Final Clinical Impressions(s) / UC Diagnoses   Final diagnoses:  COVID-19     Discharge Instructions      You have tested positive for COVID with a home test.   The goal of treatment at this time is to reduce your symptoms and discomfort   I recommend using Robitussin and Mucinex (regular formulations, nothing with decongestants or DM)  You can also use Tylenol  for body aches and fever reduction I also recommend adding an antihistamine to your daily regimen This includes medications like Claritin, Allegra, Zyrtec- the generics of these work very well and are usually less expensive I recommend using Flonase nasal spray - 2 puffs twice per day to help with your nasal congestion The antihistamines and Flonase can take a few weeks to provide significant relief from allergy  symptoms but should start to provide some benefit soon. You can use a humidifier at night to help with preventing nasal dryness and irritation   If your symptoms do not improve or become worse in the next 5-7 days please make an apt at the office so we can see you  Go to the ER if you begin to have more serious symptoms such as shortness of breath, trouble breathing, loss of consciousness, swelling around the eyes, high fever, severe lasting headaches, vision changes or neck pain/stiffness.        ED Prescriptions   None    PDMP not reviewed this encounter.   Illya Gienger E, PA-C 10/13/23 9081

## 2023-10-13 NOTE — ED Triage Notes (Signed)
Pt states that he has had body aches, headache, cough mostly at night X 3-4 days. He took an at home COVID test last night and it was positive. He isnt taking any meds for sx.

## 2023-10-14 LAB — SARS CORONAVIRUS 2 (TAT 6-24 HRS): SARS Coronavirus 2: POSITIVE — AB

## 2023-12-19 ENCOUNTER — Encounter: Payer: Self-pay | Admitting: Family Medicine

## 2024-02-15 ENCOUNTER — Other Ambulatory Visit: Payer: Self-pay

## 2024-02-15 ENCOUNTER — Emergency Department (HOSPITAL_COMMUNITY)
Admission: EM | Admit: 2024-02-15 | Discharge: 2024-02-15 | Disposition: A | Discharge status: Home / Self Care | Payer: Worker's Compensation | Attending: Emergency Medicine | Admitting: Emergency Medicine

## 2024-02-15 DIAGNOSIS — E119 Type 2 diabetes mellitus without complications: Secondary | ICD-10-CM | POA: Diagnosis not present

## 2024-02-15 DIAGNOSIS — Z7984 Long term (current) use of oral hypoglycemic drugs: Secondary | ICD-10-CM | POA: Insufficient documentation

## 2024-02-15 DIAGNOSIS — I10 Essential (primary) hypertension: Secondary | ICD-10-CM | POA: Diagnosis not present

## 2024-02-15 DIAGNOSIS — Z79899 Other long term (current) drug therapy: Secondary | ICD-10-CM | POA: Insufficient documentation

## 2024-02-15 DIAGNOSIS — T782XXA Anaphylactic shock, unspecified, initial encounter: Secondary | ICD-10-CM | POA: Insufficient documentation

## 2024-02-15 DIAGNOSIS — T7840XA Allergy, unspecified, initial encounter: Secondary | ICD-10-CM | POA: Diagnosis present

## 2024-02-15 MED ORDER — FAMOTIDINE IN NACL 20-0.9 MG/50ML-% IV SOLN
20.0000 mg | Freq: Once | INTRAVENOUS | Status: AC
  Administered 2024-02-15: 20 mg via INTRAVENOUS
  Filled 2024-02-15: qty 50

## 2024-02-15 MED ORDER — EPINEPHRINE 0.3 MG/0.3ML IJ SOAJ: 0.3000 mg | INTRAMUSCULAR | 2 refills | Status: AC | PRN

## 2024-02-15 MED ORDER — DEXAMETHASONE SODIUM PHOSPHATE 10 MG/ML IJ SOLN
10.0000 mg | Freq: Once | INTRAMUSCULAR | Status: AC
  Administered 2024-02-15: 10 mg via INTRAVENOUS
  Filled 2024-02-15: qty 1

## 2024-02-15 MED ORDER — ACETAMINOPHEN 500 MG PO TABS
1000.0000 mg | ORAL_TABLET | Freq: Once | ORAL | Status: AC
  Administered 2024-02-15: 1000 mg via ORAL
  Filled 2024-02-15: qty 2

## 2024-02-15 NOTE — ED Provider Notes (Signed)
 [place] [facility] AT [facility] Provider Note   CSN: [id number] Arrival date & time: 02/15/24  1652     History  Chief Complaint  Patient presents with   Allergic Reaction    Justin Wilson is a 43 y.o. male.  Patient is a 43 year old male with a history of hypertension and borderline diabetes who is presenting today after concern for allergic reaction.  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  He reports by the time he got to the facility's medical area he was starting to have any trouble breathing and felt like his throat was closing.  He received an EpiPen  at approximately 2:33.  He also received Benadryl.  He reports after that symptoms have improved.  He still has some burning and itching across his abdomen and still feels a bit of a headache and flushing in his face but denies any shortness of breath at this time.  He has had no prior episodes of anaphylaxis or allergy to anything that he is aware of.  He is not sure if he got stung by something or came in contact with something that would have caused this.  He did not eat anything prior to this occurring.  The history is provided by the patient and the spouse.  Allergic Reaction      Home Medications Prior to Admission medications   Medication Sig Start Date End Date Taking? Authorizing Provider  EPINEPHrine  0.3 mg/0.3 mL IJ SOAJ injection Inject 0.3 mg into the muscle as needed for anaphylaxis. 02/15/24  Yes Almond Army, MD  amLODipine  (NORVASC ) 5 MG tablet Take 1.5 tablets (7.5 mg total) by mouth daily. 04/22/23   Wilhelmena Hanson, FNP  loperamide  (IMODIUM ) 2 MG capsule Take 1 capsule (2 mg total) by mouth 4 (four) times daily as needed for diarrhea or loose stools. 04/10/23   Zackowski, Scott, MD   metFORMIN  (GLUCOPHAGE ) 500 MG tablet TAKE 1 TABLET BY MOUTH DAILY WITH SUPPER. 05/12/23   Wilhelmena Hanson, FNP  ondansetron  (ZOFRAN -ODT) 4 MG disintegrating tablet Take 1 tablet (4 mg total) by mouth every 8 (eight) hours as needed for nausea or vomiting. 04/10/23   Zackowski, Scott, MD  tamsulosin  (FLOMAX ) 0.4 MG CAPS capsule Take 1 capsule (0.4 mg total) by mouth daily after supper. Patient not taking: Reported on 06/04/2023 05/06/23   Scarlet Curly, MD      Allergies    Patient has no known allergies.    Review of Systems   Review of Systems  Physical Exam Updated Vital Signs BP 132/85   Pulse 65   Temp 97.9 F (36.6 C) (Oral)   Resp 18   SpO2 93%  Physical Exam Vitals and nursing note reviewed.  Constitutional:      General: He is not in acute distress.    Appearance: He is well-developed.  HENT:     Head: Normocephalic and atraumatic.     Mouth/Throat:     Comments: Mouth is normal with no pharyngeal swelling.  Voice is normal no stridor Eyes:     Conjunctiva/sclera: Conjunctivae normal.     Pupils: Pupils are equal, round, and reactive to light.  Cardiovascular:     Rate and Rhythm: Normal rate and regular rhythm.     Heart sounds: No murmur heard. Pulmonary:  Effort: Pulmonary effort is normal. No respiratory distress.     Breath sounds: Normal breath sounds. No wheezing or rales.  Abdominal:     General: There is no distension.     Palpations: Abdomen is soft.     Tenderness: There is no abdominal tenderness. There is no guarding or rebound.  Musculoskeletal:        General: No tenderness. Normal range of motion.     Cervical back: Normal range of motion and neck supple.  Skin:    General: Skin is warm and dry.     Findings: Rash present. No erythema.     Comments: Small evidence of urticaria over the waistline and eyelids are still slightly swollen  Neurological:     Mental Status: He is alert and oriented to person, place, and time. Mental status is at  baseline.  Psychiatric:        Behavior: Behavior normal.     ED Results / Procedures / Treatments   Labs (all labs ordered are listed, but only abnormal results are displayed) Labs Reviewed - No data to display  EKG None  Radiology No results found.  Procedures Procedures    Medications Ordered in ED Medications  dexamethasone (DECADRON) injection 10 mg (10 mg Intravenous Given 02/15/24 1805)  famotidine (PEPCID) IVPB 20 mg premix (0 mg Intravenous Stopped 02/15/24 1835)  acetaminophen (TYLENOL) tablet 1,000 mg (1,000 mg Oral Given 02/15/24 1940)    ED Course/ Medical Decision Making/ A&P                                 Medical Decision Making Risk OTC drugs. Prescription drug management.   Pt presenting today with a complaint that caries a high risk for morbidity and mortality. Patient presenting today with signs and symptoms concerning for anaphylaxis.  It is unclear what he was reacting to.  Concerned that he may have been stung or bit by something at his waistline that started this but he does not recall anything.  He did receive epinephrine  around 230 or 3:00 as well as Benadryl.  He is still having some mild swelling in his face but no oral symptoms at this time.  Will give Pepcid and Decadron as well.  Will continue to monitor till about 7:00 and will reassess.  Patient is otherwise well-appearing at this time.  8:06 PM Symptoms improved.  Pt sent home with epipen  and to follow up with allergy        Final Clinical Impression(s) / ED Diagnoses Final diagnoses:  Anaphylaxis, initial encounter    Rx / DC Orders ED Discharge Orders          Ordered    EPINEPHrine  0.3 mg/0.3 mL IJ SOAJ injection  As needed        02/15/24 2002    Ambulatory referral to Allergy        02/15/24 2004              Almond Army, MD 02/15/24 2006

## 2024-02-15 NOTE — Discharge Instructions (Addendum)
 Make sure you have an epi pen with you at all times.  Over the next day if you have itching or rash you can take benadryl every 6 hours.  If symptoms get worse return to the ER

## 2024-02-15 NOTE — ED Triage Notes (Signed)
 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 Epi Pen, 50 benadryl PO given PTA. Pt s/s are improving

## 2024-02-17 ENCOUNTER — Telehealth (HOSPITAL_BASED_OUTPATIENT_CLINIC_OR_DEPARTMENT_OTHER): Payer: Self-pay | Admitting: *Deleted

## 2024-02-17 NOTE — Telephone Encounter (Signed)
 Copied from CRM [id number]. Topic: Appointments - Scheduling Inquiry for Clinic >> Feb 17, 2024  9:05 AM [name] wrote: Reason for CRM: Patient needs to be scheduled for an ER followup visit-allergic reaction. Schedule shows no availability until 03/21/24. Please contact patient for scheduling

## 2024-02-18 ENCOUNTER — Ambulatory Visit (HOSPITAL_BASED_OUTPATIENT_CLINIC_OR_DEPARTMENT_OTHER): Admitting: Family Medicine

## 2024-02-18 ENCOUNTER — Encounter (HOSPITAL_BASED_OUTPATIENT_CLINIC_OR_DEPARTMENT_OTHER): Payer: Self-pay | Admitting: Family Medicine

## 2024-02-18 ENCOUNTER — Other Ambulatory Visit: Payer: Self-pay | Admitting: Medical Genetics

## 2024-02-18 ENCOUNTER — Encounter (HOSPITAL_BASED_OUTPATIENT_CLINIC_OR_DEPARTMENT_OTHER): Payer: Self-pay | Admitting: *Deleted

## 2024-02-18 VITALS — BP 155/110 | HR 68 | Ht 75.0 in | Wt 327.8 lb

## 2024-02-18 DIAGNOSIS — I10 Essential (primary) hypertension: Secondary | ICD-10-CM

## 2024-02-18 DIAGNOSIS — R400 Somnolence: Secondary | ICD-10-CM

## 2024-02-18 DIAGNOSIS — R519 Headache, unspecified: Secondary | ICD-10-CM

## 2024-02-18 MED ORDER — AMLODIPINE BESYLATE 5 MG PO TABS
5.0000 mg | ORAL_TABLET | Freq: Every day | ORAL | 1 refills | Status: DC
Start: 2024-02-18 — End: 2024-05-18

## 2024-02-18 NOTE — Progress Notes (Signed)
    Procedures performed today:    None.  Independent interpretation of notes and tests performed by another provider:   None.  Brief History, Exam, Impression, and Recommendations:    BP (!) 155/110 (BP Location: Right Arm, Patient Position: Sitting, Cuff Size: Large)   Pulse 68   Ht 6' 3 (1.905 m)   Wt (!) 327 lb 12.8 oz (148.7 kg)   SpO2 96%   BMI 40.97 kg/m   Patient was seen at the emergency department recently due to anaphylactic reaction.  This was felt to be related to possible bite or sting on the abdomen.  He did receive epinephrine  for treatment as well as Benadryl  and ultimately responded well in the emergency department.  Since leaving the emergency department, he has been doing okay but still has a little bit of wheezing at times he feels.  He was referred to allergist and has appointment scheduled early next month.  Primary hypertension Assessment & Plan: Blood pressure elevated in office today.  He has not been taking medication recently.  He is very overdue for follow-up of chronic medical conditions.  He denies experiencing any side effects or issues with amlodipine  in the past.  Was previously prescribed 7.5 mg dose. We can look to restart with amlodipine , however at slightly lower dose of 5 mg.  Recommend intermittent monitoring of blood pressure at home, DASH diet.  Orders: -     amLODIPine  Besylate; Take 1 tablet (5 mg total) by mouth daily.  Dispense: 90 tablet; Refill: 1  Daytime somnolence Assessment & Plan: Does report issues with snoring and daytime somnolence.  Does not always wake up feeling rested in the morning.  Has had underlying issues of hypertension as well as prediabetes.  Current BMI greater than 40.  Notable risk factors for potential underlying sleep apnea.  He reports that he has had evaluation in the past at least 1 year ago.  Would be open to retesting in this regard. Referral to sleep specialist placed today  Orders: -     Pulmonary  Visit  Nonintractable headache, unspecified chronicity pattern, unspecified headache type Assessment & Plan: Has been noting issue with headaches recently.  Has primarily been felt following recent anaphylactic episode.  Reports that headaches have been more frequent, have been waking him up out of sleep at night.  Does not have history of headaches. Given risk factors as well as history, would be appropriate to proceed with MRI to exclude underlying structural cause, order placed today  Orders: -     MR BRAIN WO CONTRAST; Future  Patient does have appointment scheduled in about 3 to 4 weeks, we will continue with scheduled appointment   ___________________________________________ Consuelo Thayne de [place], MD, ABFM, [facility] Primary Care and Sports Medicine [facility]

## 2024-02-18 NOTE — Assessment & Plan Note (Signed)
 Does report issues with snoring and daytime somnolence.  Does not always wake up feeling rested in the morning.  Has had underlying issues of hypertension as well as prediabetes.  Current BMI greater than 40.  Notable risk factors for potential underlying sleep apnea.  He reports that he has had evaluation in the past at least 1 year ago.  Would be open to retesting in this regard. Referral to sleep specialist placed today

## 2024-02-18 NOTE — Assessment & Plan Note (Signed)
 Blood pressure elevated in office today.  He has not been taking medication recently.  He is very overdue for follow-up of chronic medical conditions.  He denies experiencing any side effects or issues with amlodipine  in the past.  Was previously prescribed 7.5 mg dose. We can look to restart with amlodipine , however at slightly lower dose of 5 mg.  Recommend intermittent monitoring of blood pressure at home, DASH diet.

## 2024-02-18 NOTE — Patient Instructions (Signed)
  Medication Instructions:  Your physician recommends that you continue on your current medications as directed. Please refer to the Current Medication list given to you today. --If you need a refill on any your medications before your next appointment, please call your pharmacy first. If no refills are authorized on file call the office.--   Referrals/Procedures/Imaging: MRI ordered  Follow-Up: Your next appointment:   Your physician recommends that you schedule a follow-up appointment in: keep appointment  with Dr. de Peru  You will receive a text message or e-mail with a link to a survey about your care and experience with us  today! We would greatly appreciate your feedback!   Thanks for letting us  be apart of your health journey!!  Primary Care and Sports Medicine   Dr. Court Distance Peru   We encourage you to activate your patient portal called MyChart.  Sign up information is provided on this After Visit Summary.  MyChart is used to connect with patients for Virtual Visits (Telemedicine).  Patients are able to view lab/test results, encounter notes, upcoming appointments, etc.  Non-urgent messages can be sent to your provider as well. To learn more about what you can do with MyChart, please visit --  [email or web address].

## 2024-02-18 NOTE — Assessment & Plan Note (Signed)
 Has been noting issue with headaches recently.  Has primarily been felt following recent anaphylactic episode.  Reports that headaches have been more frequent, have been waking him up out of sleep at night.  Does not have history of headaches. Given risk factors as well as history, would be appropriate to proceed with MRI to exclude underlying structural cause, order placed today

## 2024-02-22 ENCOUNTER — Emergency Department (HOSPITAL_BASED_OUTPATIENT_CLINIC_OR_DEPARTMENT_OTHER)

## 2024-02-22 ENCOUNTER — Other Ambulatory Visit: Payer: Self-pay

## 2024-02-22 ENCOUNTER — Emergency Department (HOSPITAL_BASED_OUTPATIENT_CLINIC_OR_DEPARTMENT_OTHER)
Admission: EM | Admit: 2024-02-22 | Discharge: 2024-02-22 | Disposition: A | Discharge status: Home / Self Care | Attending: Emergency Medicine | Admitting: Emergency Medicine

## 2024-02-22 ENCOUNTER — Encounter (HOSPITAL_BASED_OUTPATIENT_CLINIC_OR_DEPARTMENT_OTHER): Payer: Self-pay

## 2024-02-22 ENCOUNTER — Emergency Department (HOSPITAL_COMMUNITY)

## 2024-02-22 DIAGNOSIS — I10 Essential (primary) hypertension: Secondary | ICD-10-CM | POA: Insufficient documentation

## 2024-02-22 DIAGNOSIS — Z79899 Other long term (current) drug therapy: Secondary | ICD-10-CM | POA: Insufficient documentation

## 2024-02-22 DIAGNOSIS — R519 Headache, unspecified: Secondary | ICD-10-CM | POA: Insufficient documentation

## 2024-02-22 LAB — CBC WITH DIFFERENTIAL/PLATELET
Abs Immature Granulocytes: 0.05 10*3/uL (ref 0.00–0.07)
Basophils Absolute: 0.1 10*3/uL (ref 0.0–0.1)
Basophils Relative: 1 %
Eosinophils Absolute: 0.5 10*3/uL (ref 0.0–0.5)
Eosinophils Relative: 4 %
HCT: 45.1 % (ref 39.0–52.0)
Hemoglobin: 15.1 g/dL (ref 13.0–17.0)
Immature Granulocytes: 1 %
Lymphocytes Relative: 25 %
Lymphs Abs: 2.7 10*3/uL (ref 0.7–4.0)
MCH: 32.4 pg (ref 26.0–34.0)
MCHC: 33.5 g/dL (ref 30.0–36.0)
MCV: 96.8 fL (ref 80.0–100.0)
Monocytes Absolute: 0.8 10*3/uL (ref 0.1–1.0)
Monocytes Relative: 8 %
Neutro Abs: 6.5 10*3/uL (ref 1.7–7.7)
Neutrophils Relative %: 61 %
Platelets: 199 10*3/uL (ref 150–400)
RBC: 4.66 MIL/uL (ref 4.22–5.81)
RDW: 12.7 % (ref 11.5–15.5)
WBC: 10.6 10*3/uL — ABNORMAL HIGH (ref 4.0–10.5)
nRBC: 0 % (ref 0.0–0.2)

## 2024-02-22 LAB — URINALYSIS, ROUTINE W REFLEX MICROSCOPIC
Bacteria, UA: NONE SEEN
Bilirubin Urine: NEGATIVE
Glucose, UA: NEGATIVE mg/dL
Hgb urine dipstick: NEGATIVE
Ketones, ur: NEGATIVE mg/dL
Nitrite: NEGATIVE
Protein, ur: NEGATIVE mg/dL
Specific Gravity, Urine: 1.025 (ref 1.005–1.030)
pH: 6 (ref 5.0–8.0)

## 2024-02-22 LAB — CBG MONITORING, ED: Glucose-Capillary: 96 mg/dL (ref 70–99)

## 2024-02-22 MED ORDER — SODIUM CHLORIDE 0.9 % IV BOLUS
1000.0000 mL | Freq: Once | INTRAVENOUS | Status: AC
  Administered 2024-02-22: 1000 mL via INTRAVENOUS

## 2024-02-22 MED ORDER — GADOBUTROL 1 MMOL/ML IV SOLN
10.0000 mL | Freq: Once | INTRAVENOUS | Status: AC | PRN
Start: 2024-02-22 — End: 2024-02-22
  Administered 2024-02-22: 10 mL via INTRAVENOUS

## 2024-02-22 MED ORDER — KETOROLAC TROMETHAMINE 10 MG PO TABS: 10.0000 mg | ORAL_TABLET | Freq: Four times a day (QID) | ORAL | 0 refills | Status: AC | PRN

## 2024-02-22 MED ORDER — DIPHENHYDRAMINE HCL 50 MG/ML IJ SOLN
12.5000 mg | Freq: Once | INTRAMUSCULAR | Status: AC
  Administered 2024-02-22: 12.5 mg via INTRAVENOUS
  Filled 2024-02-22: qty 1

## 2024-02-22 MED ORDER — METOCLOPRAMIDE HCL 10 MG PO TABS: 10.0000 mg | ORAL_TABLET | Freq: Four times a day (QID) | ORAL | 0 refills | Status: DC

## 2024-02-22 MED ORDER — KETOROLAC TROMETHAMINE 30 MG/ML IJ SOLN
30.0000 mg | Freq: Once | INTRAMUSCULAR | Status: AC
  Administered 2024-02-22: 30 mg via INTRAVENOUS
  Filled 2024-02-22: qty 1

## 2024-02-22 MED ORDER — METOCLOPRAMIDE HCL 5 MG/ML IJ SOLN
10.0000 mg | Freq: Once | INTRAMUSCULAR | Status: AC
  Administered 2024-02-22: 10 mg via INTRAVENOUS
  Filled 2024-02-22: qty 2

## 2024-02-22 NOTE — ED Provider Notes (Signed)
 Pt transferred from [facility] for MRI.  Pt initially presented with migraine sx and had a CT which showed some nonspecific changes in his frontal lobe.  He had not had any medications for his headache prior to transfer.  He is given fluids, toradol, reglan and benadryl here.   MRIs have been completed, but are not read.  Pt is feeling better, but still has a little headache.  If MRI nl, he will need to go home with meds for migraines.   Sueellen Emery, MD 02/22/24 1549

## 2024-02-22 NOTE — ED Notes (Signed)
 Report attempted to [facility] ED charge via phone... Message sent.Aaron AasAaron Aas

## 2024-02-22 NOTE — ED Provider Notes (Signed)
 [place] EMERGENCY DEPARTMENT AT [facility] Provider Note   CSN: [id number] Arrival date & time: 02/22/24  4098     Patient presents with: Headache and Generalized Body Aches   Justin Wilson is a 43 y.o. male.   HPI     43 year old male with a history of hypertension, borderline diabetes treatment of anaphylaxis on June 9 who presents with concern for headache.   Had epi pen last week Having pressure to top of head, feeling it all over, now behind eyes, waking him from sleep, has to focus a little more and take time getting up, blurred vision initially.  No other double vision, missing pieces of vision Headache has been constant, now is 11/10.  Will wax and wane, improved with BC powders, worse when going to sleep and first wake up Never had headaches before Worse with bright lights right now Back, hip pain, right foot with pain and bump that popped up Denies numbness, weakness, difficulty talking or walking, visual changes or facial droop.   No nausea, no vomiting Did have diarrhea and urinating a lot, more than usual   Past Medical History:  Diagnosis Date   Hypertension     Prior to Admission medications   Medication Sig Start Date End Date Taking? Authorizing Provider  amLODipine  (NORVASC ) 5 MG tablet Take 1 tablet (5 mg total) by mouth daily. 02/18/24   de Peru, Alonza Jansky, MD  EPINEPHrine  0.3 mg/0.3 mL IJ SOAJ injection Inject 0.3 mg into the muscle as needed for anaphylaxis. 02/15/24   Almond Army, MD  loperamide  (IMODIUM ) 2 MG capsule Take 1 capsule (2 mg total) by mouth 4 (four) times daily as needed for diarrhea or loose stools. 04/10/23   Zackowski, Scott, MD  metFORMIN  (GLUCOPHAGE ) 500 MG tablet TAKE 1 TABLET BY MOUTH DAILY WITH SUPPER. Patient not taking: Reported on 02/18/2024 05/12/23   Wilhelmena Hanson, FNP  ondansetron  (ZOFRAN -ODT) 4 MG disintegrating tablet Take 1 tablet (4 mg total) by mouth every 8 (eight) hours as needed for nausea or  vomiting. 04/10/23   Zackowski, Scott, MD  tamsulosin  (FLOMAX ) 0.4 MG CAPS capsule Take 1 capsule (0.4 mg total) by mouth daily after supper. Patient not taking: Reported on 06/04/2023 05/06/23   Scarlet Curly, MD    Allergies: Patient has no known allergies.    Review of Systems  Updated Vital Signs BP (!) 137/90 (BP Location: Right Arm)   Pulse 76   Temp 97.8 F (36.6 C) (Oral)   Resp 18   Ht 6' 3 (1.905 m)   Wt (!) 148.3 kg   SpO2 98%   BMI 40.87 kg/m   Physical Exam Vitals and nursing note reviewed.  Constitutional:      General: He is not in acute distress.    Appearance: Normal appearance. He is well-developed. He is not ill-appearing or diaphoretic.  HENT:     Head: Normocephalic and atraumatic.   Eyes:     General: No visual field deficit.    Extraocular Movements: Extraocular movements intact.     Conjunctiva/sclera: Conjunctivae normal.     Pupils: Pupils are equal, round, and reactive to light.    Cardiovascular:     Rate and Rhythm: Normal rate and regular rhythm.     Pulses: Normal pulses.     Heart sounds: Normal heart sounds. No murmur heard.    No friction rub. No gallop.  Pulmonary:     Effort: Pulmonary effort is normal. No respiratory distress.  Breath sounds: Normal breath sounds. No wheezing or rales.  Abdominal:     General: There is no distension.     Palpations: Abdomen is soft.     Tenderness: There is no abdominal tenderness. There is no guarding.   Musculoskeletal:        General: No swelling or tenderness.     Cervical back: Normal range of motion.   Skin:    General: Skin is warm and dry.     Findings: No erythema or rash.   Neurological:     General: No focal deficit present.     Mental Status: He is alert and oriented to person, place, and time.     GCS: GCS eye subscore is 4. GCS verbal subscore is 5. GCS motor subscore is 6.     Cranial Nerves: No cranial nerve deficit, dysarthria or facial asymmetry.     Sensory: No  sensory deficit.     Motor: No weakness or tremor.     Coordination: Coordination normal. Finger-Nose-Finger Test normal.     Gait: Gait normal.     (all labs ordered are listed, but only abnormal results are displayed) Labs Reviewed  URINALYSIS, ROUTINE W REFLEX MICROSCOPIC - Abnormal; Notable for the following components:      Result Value   Leukocytes,Ua SMALL (*)    All other components within normal limits  CBC WITH DIFFERENTIAL/PLATELET - Abnormal; Notable for the following components:   WBC 10.6 (*)    All other components within normal limits  COMPREHENSIVE METABOLIC PANEL WITH GFR  CBG MONITORING, ED    EKG: None  Radiology: CT Head Wo Contrast Result Date: 02/22/2024 CLINICAL DATA:  43 year old male with increasing headache, status post allergic reaction requiring epinephrine  last week. EXAM: CT HEAD WITHOUT CONTRAST TECHNIQUE: Contiguous axial images were obtained from the base of the skull through the vertex without intravenous contrast. RADIATION DOSE REDUCTION: This exam was performed according to the departmental dose-optimization program which includes automated exposure control, adjustment of the mA and/or kV according to patient size and/or use of iterative reconstruction technique. COMPARISON:  None Available. FINDINGS: Brain: Normal cerebral volume. Incidental dural calcifications, especially the anterior falx. No midline shift, ventriculomegaly, mass effect, evidence of mass lesion, intracranial hemorrhage or evidence of cortically based acute infarction. Gray-white matter differentiation is largely normal. There is mild nonspecific patchy white matter hypodensity in the left frontal lobe on series 2, image 19. No cortical encephalomalacia. Partially empty sella. Vascular: No suspicious intracranial vascular hyperdensity. Skull: Intact, negative. Sinuses/Orbits: Visualized paranasal sinuses and mastoids are clear. Other: Visualized orbits and scalp soft tissues are  within normal limits. IMPRESSION: 1. No acute intracranial abnormality. 2. Mild nonspecific changes in the left frontal lobe white matter, most commonly due to small vessel disease. Electronically Signed   By: Marlise Simpers M.D.   On: 02/22/2024 07:59     Procedures   Medications Ordered in the ED - No data to display                                   43 year old male with a history of hypertension, borderline diabetes treatment of anaphylaxis on June 9 who presents with concern for headache that began after he received steroids, epinephrine  for anaphylaxis.  Differential diagnosis includes ICH, CVT, mass, other headache or migraine, IIH, and with urinary frequency consider new onset diabetes, urinary tract infection.  Low clinical suspicion for meningitis,  encephalitis.  CBC shows mild leukocytosis, no anemia. CMP pending. UA with ?infection with 11-20 WBC< however no bacteria. Glucose WNL.   CT head was completed and showed no acute intracranial abnormality, shows mild nonspecific changes in the left frontal lobe white matter most commonly due to small vessel disease.  Given the white matter hypodensity in the left frontal lobe, headaches waking him from sleep, will transfer for MRI brain with and without and MRV.   He would like to drive there, will hold on sedating medications for headache at this time.  Transferred for further care.        Final diagnoses:  Acute nonintractable headache, unspecified headache type    ED Discharge Orders     None          Scarlette Currier, MD 02/22/24 Jerre Moots

## 2024-02-22 NOTE — ED Notes (Signed)
 Pt understood where and how to get to [facility] ED... Pt AO x4... IV placed and cleared by MD for transport POV.Justin AasAaron AasAaron Wilson

## 2024-02-22 NOTE — ED Notes (Signed)
 Headache has been off and on since he had a epi pen administrated a week prior... Light or sound doesn't worsen headache.Justin AasAaron Wilson

## 2024-02-22 NOTE — ED Notes (Signed)
 Pt aware of the need for a urine... Unable to currently provide a sample.Marland KitchenMarland Kitchen

## 2024-02-22 NOTE — ED Triage Notes (Signed)
 Pt arrives POV from home d/t worsening HA. Pt seen here last week for a severe allergic reaction. Pt received epi pen and states I havent feel right since Pt took multiple OTC medications to help relieve HA and did not get relief. Denies blurred vision, dizziness, numbness/tingling to all extremities.

## 2024-02-22 NOTE — ED Provider Notes (Signed)
 Patient accepted at change of shift, the patient has no neurologic symptoms, headache better after headache cocktail, MRI completely negative MRV negative, patient is very stable for discharge.  I have discussed with the patient at the bedside the results, and the meaning of these results.  They have had opportunity to ask questions,  expressed their understanding to the need for follow-up with primary care physician   Early Glisson, MD 02/22/24 [phone]

## 2024-02-22 NOTE — Discharge Instructions (Addendum)
 Thankfully the MRI of your brain shows no signs of stroke, I have prescribed 2 different medications for you what is called Toradol which can be taken every 6 hours as needed, you should not take this for any more than 3 days in a row.  This is similar to ibuprofen or Aleve so do not take those medications with this.  Metoclopramide or Reglan is a medication that can be used for several different reasons including nausea and headaches.  I would encourage you to take 1 tablet every 8 hours as needed if you are developing headache that is not getting better with your usual medications.  There is a slight chance that you may have a side effect of this medication which is a feeling of anxiety agitation or muscle stiffness.  If that occurs please take 1 tablet of Benadryl and call your doctor immediately.  If it is not getting better come back to the emergency department.  Thank you for allowing us  to treat you in the emergency department today.  After reviewing your examination and potential testing that was done it appears that you are safe to go home.  I would like for you to follow-up with your doctor within the next several days, have them obtain your records and follow-up with them to review all potential tests and results from your visit.  If you should develop severe or worsening symptoms return to the emergency department immediately

## 2024-02-23 ENCOUNTER — Other Ambulatory Visit (HOSPITAL_COMMUNITY)
Admission: RE | Admit: 2024-02-23 | Discharge: 2024-02-23 | Disposition: A | Discharge status: Home / Self Care | Payer: Self-pay | Attending: Medical Genetics | Admitting: Medical Genetics

## 2024-02-26 ENCOUNTER — Other Ambulatory Visit

## 2024-02-29 ENCOUNTER — Ambulatory Visit (HOSPITAL_BASED_OUTPATIENT_CLINIC_OR_DEPARTMENT_OTHER): Admitting: Family Medicine

## 2024-03-04 LAB — GENECONNECT MOLECULAR SCREEN: Genetic Analysis Overall Interpretation: NEGATIVE

## 2024-03-07 NOTE — Progress Notes (Unsigned)
 New Patient Note  RE: Justin Wilson MRN: [id number] DOB: May 21, 1981 Date of Office Visit: 03/08/2024  Consult requested by: Doretha Folks, MD Primary care provider: de Peru, Raymond J, MD  Chief Complaint: No chief complaint on file.  History of Present Illness: I had the pleasure of seeing Justin Wilson for initial evaluation at the [facility] on 03/08/2024. He is a 43 y.o. male, who is referred here by de Peru, Quintin PARAS, MD for the evaluation of allergic reactions.  Discussed the use of AI scribe software for clinical note transcription with the patient, who gave verbal consent to proceed.  History of Present Illness             02/15/2024 ER visit: Patient is a 43 year old male with a history of hypertension and borderline diabetes who is presenting today after concern for allergic reaction. 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 He reports by the time he got to the facility's medical area he was starting to have any trouble breathing and felt like his throat was closing. He received an EpiPen  at approximately 2:33. He also received Benadryl . He reports after that symptoms have improved. He still has some burning and itching across his abdomen and still feels a bit of a headache and flushing in his face but denies any shortness of breath at this time. He has had no prior episodes of anaphylaxis or allergy to anything that he is aware of. He is not sure if he got stung by something or came in contact with something that would have caused this. He did not eat anything prior to this occurring.  Pt presenting today with a complaint that caries a high risk for morbidity and mortality. Patient presenting today with signs and symptoms concerning for anaphylaxis.  It  is unclear what he was reacting to.  Concerned that he may have been stung or bit by something at his waistline that started this but he does not recall anything.  He did receive epinephrine  around 230 or 3:00 as well as Benadryl .  He is still having some mild swelling in his face but no oral symptoms at this time.  Will give Pepcid  and Decadron  as well.  Will continue to monitor till about 7:00 and will reassess.  Patient is otherwise well-appearing at this time.  Assessment and Plan: Darly is a 43 y.o. male with: ***  Assessment and Plan               No follow-ups on file.  No orders of the defined types were placed in this encounter.  Lab Orders  No laboratory test(s) ordered today    Other allergy screening: Asthma: {Blank single:19197::yes,no} Rhino conjunctivitis: {Blank single:19197::yes,no} Food allergy: {Blank single:19197::yes,no} Medication allergy: {Blank single:19197::yes,no} Hymenoptera allergy: {Blank single:19197::yes,no} Urticaria: {Blank single:19197::yes,no} Eczema:{Blank single:19197::yes,no} History of recurrent infections suggestive of immunodeficency: {Blank single:19197::yes,no}  Diagnostics: Spirometry:  Tracings reviewed. His effort: {Blank single:19197::Good reproducible efforts.,It was hard to get consistent efforts and there is a question as to whether this reflects a maximal maneuver.,Poor effort, data can not be interpreted.} FVC: ***L FEV1: ***L, ***% predicted FEV1/FVC ratio: ***% Interpretation: {Blank single:19197::Spirometry consistent with mild obstructive disease,Spirometry consistent with moderate obstructive disease,Spirometry consistent with severe obstructive disease,Spirometry consistent with possible restrictive disease,Spirometry consistent with mixed obstructive and  restrictive disease,Spirometry uninterpretable due to technique,Spirometry consistent with normal pattern,No  overt abnormalities noted given today's efforts}.  Please see scanned spirometry results for details.  Skin Testing: {Blank single:19197::Select foods,Environmental allergy panel,Environmental allergy panel and select foods,Food allergy panel,None,Deferred due to recent antihistamines use}. *** Results discussed with patient/family.   Past Medical History: Patient Active Problem List   Diagnosis Date Noted   Daytime somnolence 02/18/2024   Headache 02/18/2024   Chronic pain of both knees 04/14/2023   Hypochondriacal pain 04/14/2023   Pre-diabetes 04/14/2023   Glucosuria 12/25/2022   Constipation 12/25/2022   Increased thirst 12/25/2022   Hypertension 08/26/2021   Wellness examination 06/03/2021   Urinary frequency 06/03/2021   Severe obesity (BMI >= 40) (HCC) 04/18/2021   Plantar fasciitis of left foot 04/18/2021   Low back pain 04/18/2021   Past Medical History:  Diagnosis Date   Hypertension    Past Surgical History: No past surgical history on file. Medication List:  Current Outpatient Medications  Medication Sig Dispense Refill   amLODipine  (NORVASC ) 5 MG tablet Take 1 tablet (5 mg total) by mouth daily. 90 tablet 1   EPINEPHrine  0.3 mg/0.3 mL IJ SOAJ injection Inject 0.3 mg into the muscle as needed for anaphylaxis. 1 each 2   ketorolac  (TORADOL ) 10 MG tablet Take 1 tablet (10 mg total) by mouth every 6 (six) hours as needed. 20 tablet 0   loperamide  (IMODIUM ) 2 MG capsule Take 1 capsule (2 mg total) by mouth 4 (four) times daily as needed for diarrhea or loose stools. 30 capsule 0   metFORMIN  (GLUCOPHAGE ) 500 MG tablet TAKE 1 TABLET BY MOUTH DAILY WITH SUPPER. (Patient not taking: Reported on 02/18/2024) 90 tablet 0   metoCLOPramide  (REGLAN ) 10 MG tablet Take 1 tablet (10 mg total) by mouth every 6 (six) hours. 30 tablet 0   ondansetron  (ZOFRAN -ODT) 4 MG disintegrating tablet Take 1 tablet (4 mg total) by mouth every 8 (eight) hours as needed for nausea or  vomiting. 12 tablet 0   tamsulosin  (FLOMAX ) 0.4 MG CAPS capsule Take 1 capsule (0.4 mg total) by mouth daily after supper. (Patient not taking: Reported on 06/04/2023) 90 capsule 3   No current facility-administered medications for this visit.   Allergies: No Known Allergies Social History: Social History   Socioeconomic History   Marital status: Single    Spouse name: Not on file   Number of children: Not on file   Years of education: Not on file   Highest education level: Some college, no degree  Occupational History   Occupation: Pensions consultant    Comment: Warehouse  Tobacco Use   Smoking status: Every Day    Current packs/day: 1.00    Types: Cigarettes    Passive exposure: Current   Smokeless tobacco: Never  Vaping Use   Vaping status: Never Used  Substance and Sexual Activity   Alcohol use: Not Currently   Drug use: Not Currently    Types: Marijuana    Comment: recently quit about 6 months ago   Sexual activity: Yes  Other Topics Concern   Not on file  Social History Narrative   Not on file   Social Drivers of Health   Financial Resource Strain: High Risk (04/21/2023)   Overall Financial Resource Strain (CARDIA)    Difficulty of Paying Living Expenses: Very hard  Food Insecurity: Food Insecurity Present (04/21/2023)   Hunger Vital Sign    Worried About Running Out of Food in the Last Year: Often true  Ran Out of Food in the Last Year: Sometimes true  Transportation Needs: No Transportation Needs (04/21/2023)   PRAPARE - Administrator, Civil Service (Medical): No    Lack of Transportation (Non-Medical): No  Physical Activity: Sufficiently Active (04/21/2023)   Exercise Vital Sign    Days of Exercise per Week: 3 days    Minutes of Exercise per Session: 60 min  Stress: No Stress Concern Present (04/21/2023)   [employer] of Occupational Health - Occupational Stress Questionnaire    Feeling of Stress : Only a little  Social Connections:  Moderately Isolated (04/21/2023)   Social Connection and Isolation Panel    Frequency of Communication with Friends and Family: More than three times a week    Frequency of Social Gatherings with Friends and Family: More than three times a week    Attends Religious Services: Never    Database administrator or Organizations: Yes    Attends Engineer, structural: More than 4 times per year    Marital Status: Never married   Lives in a ***. Smoking: *** Occupation: ***  Environmental HistorySurveyor, minerals in the house: Network engineer in the family room: {Blank single:19197::yes,no} Carpet in the bedroom: {Blank single:19197::yes,no} Heating: {Blank single:19197::electric,gas,heat pump} Cooling: {Blank single:19197::central,window,heat pump} Pet: {Blank single:19197::yes ***,no}  Family History: Family History  Problem Relation Age of Onset   Benign prostatic hyperplasia Maternal Grandfather        he had something with his prostate   Problem                               Relation Asthma                                   *** Eczema                                *** Food allergy                          *** Allergic rhino conjunctivitis     ***  Review of Systems  Constitutional:  Negative for appetite change, chills, fever and unexpected weight change.  HENT:  Negative for congestion and rhinorrhea.   Eyes:  Negative for itching.  Respiratory:  Negative for cough, chest tightness, shortness of breath and wheezing.   Cardiovascular:  Negative for chest pain.  Gastrointestinal:  Negative for abdominal pain.  Genitourinary:  Negative for difficulty urinating.  Skin:  Negative for rash.  Neurological:  Negative for headaches.    Objective: There were no vitals taken for this visit. There is no height or weight on file to calculate BMI. Physical Exam Vitals and nursing note reviewed.  Constitutional:       Appearance: Normal appearance. He is well-developed.  HENT:     Head: Normocephalic and atraumatic.     Right Ear: Tympanic membrane and external ear normal.     Left Ear: Tympanic membrane and external ear normal.     Nose: Nose normal.     Mouth/Throat:     Mouth: Mucous membranes are moist.     Pharynx: Oropharynx is clear.   Eyes:     Conjunctiva/sclera: Conjunctivae normal.    Cardiovascular:  Rate and Rhythm: Normal rate and regular rhythm.     Heart sounds: Normal heart sounds. No murmur heard.    No friction rub. No gallop.  Pulmonary:     Effort: Pulmonary effort is normal.     Breath sounds: Normal breath sounds. No wheezing, rhonchi or rales.   Musculoskeletal:     Cervical back: Neck supple.   Skin:    General: Skin is warm.     Findings: No rash.   Neurological:     Mental Status: He is alert and oriented to person, place, and time.   Psychiatric:        Behavior: Behavior normal.    The plan was reviewed with the patient/family, and all questions/concerned were addressed.  It was my pleasure to see Shahiem today and participate in his care. Please feel free to contact me with any questions or concerns.  Sincerely,  Orlan Cramp, DO Allergy & Immunology  [facility]  [place] office: [phone] [facility] office: [phone]

## 2024-03-08 ENCOUNTER — Encounter: Payer: Self-pay | Admitting: Allergy

## 2024-03-08 ENCOUNTER — Ambulatory Visit: Admitting: Allergy

## 2024-03-08 VITALS — BP 138/88 | HR 81 | Temp 98.4°F | Resp 16 | Ht 73.43 in | Wt 330.5 lb

## 2024-03-08 DIAGNOSIS — T7840XD Allergy, unspecified, subsequent encounter: Secondary | ICD-10-CM | POA: Diagnosis not present

## 2024-03-08 DIAGNOSIS — R21 Rash and other nonspecific skin eruption: Secondary | ICD-10-CM | POA: Diagnosis not present

## 2024-03-08 DIAGNOSIS — Z91038 Other insect allergy status: Secondary | ICD-10-CM | POA: Diagnosis not present

## 2024-03-08 DIAGNOSIS — T7840XA Allergy, unspecified, initial encounter: Secondary | ICD-10-CM

## 2024-03-08 DIAGNOSIS — R030 Elevated blood-pressure reading, without diagnosis of hypertension: Secondary | ICD-10-CM | POA: Diagnosis not present

## 2024-03-08 NOTE — Patient Instructions (Addendum)
 Allergic reactions Concerning for stinging insect reaction. Continue to avoid -handout given.  For mild symptoms you can take over the counter antihistamines (zyrtec 10mg  to 20mg ) and monitor symptoms closely.  If symptoms worsen or if you have severe symptoms including breathing issues, throat closure, significant swelling, whole body hives, severe diarrhea and vomiting, lightheadedness then use epinephrine  and seek immediate medical care afterwards. Emergency action plan given. Get bloodwork If positive will recommend allergy shots. If negative will recommend skin testing next - this is done once a month in our [employer] office.  We are ordering labs, so please allow 1-2 weeks for the results to come back. With the newly implemented Cures Act, the labs might be visible to you at the same time that they become visible to me. However, I will not address the results until all of the results are back, so please be patient.  In the meantime, continue recommendations in your patient instructions, including avoidance measures (if applicable), until you hear from me.  Skin See below for proper skin care. You may use over the counter benadryl  and hydrocortisone cream as needed.  Elevated blood pressure  Blood pressure reading was high in our office today. Vitals:   03/08/24 0938  BP: (!) 144/86   Please follow up with PCP regarding this.    Follow up in 1 year or sooner if needed.

## 2024-03-13 LAB — HYMENOPTERA VENOM ALLERGY II
Bumblebee: <0.1 kU/L
Hornet, White Face, IgE: 1.41 kU/L — AB
Hornet, Yellow, IgE: 0.13 kU/L — AB
I001-IgE Honeybee: <0.1 kU/L
I003-IgE Yellow Jacket: 7.48 kU/L — AB
I004-IgE Paper Wasp: 8.35 kU/L — AB
I208-IgE Api m 1: <0.1 kU/L
I209-IgE Ves v 5: 11.1 kU/L — AB
I210-IgE Pol d 5: 12.6 kU/L — AB
I211-IgE Ves v 1: <0.1 kU/L
I214-IgE Api m 2: <0.1 kU/L
I215-IgE Api m 3: <0.1 kU/L
I216-IgE Api m 5: <0.1 kU/L
I217-IgE Api m 10: <0.1 kU/L
Tryptase: 8.2 ug/L (ref 2.2–13.2)

## 2024-03-13 LAB — ALLERGEN FIRE ANT: I070-IgE Fire Ant (Invicta): 5.7 kU/L — AB

## 2024-03-13 LAB — ALLERGEN COMPONENT COMMENTS

## 2024-03-14 ENCOUNTER — Emergency Department (HOSPITAL_BASED_OUTPATIENT_CLINIC_OR_DEPARTMENT_OTHER): Payer: Worker's Compensation | Admitting: Radiology

## 2024-03-14 ENCOUNTER — Emergency Department (HOSPITAL_BASED_OUTPATIENT_CLINIC_OR_DEPARTMENT_OTHER)
Admission: EM | Admit: 2024-03-14 | Discharge: 2024-03-14 | Disposition: A | Discharge status: Home / Self Care | Payer: Worker's Compensation | Attending: Student | Admitting: Student

## 2024-03-14 ENCOUNTER — Other Ambulatory Visit: Payer: Self-pay

## 2024-03-14 ENCOUNTER — Ambulatory Visit: Payer: Self-pay | Admitting: Allergy

## 2024-03-14 DIAGNOSIS — N179 Acute kidney failure, unspecified: Secondary | ICD-10-CM | POA: Insufficient documentation

## 2024-03-14 DIAGNOSIS — I10 Essential (primary) hypertension: Secondary | ICD-10-CM | POA: Insufficient documentation

## 2024-03-14 DIAGNOSIS — D72829 Elevated white blood cell count, unspecified: Secondary | ICD-10-CM | POA: Insufficient documentation

## 2024-03-14 DIAGNOSIS — R112 Nausea with vomiting, unspecified: Secondary | ICD-10-CM | POA: Diagnosis present

## 2024-03-14 DIAGNOSIS — E860 Dehydration: Secondary | ICD-10-CM | POA: Diagnosis not present

## 2024-03-14 DIAGNOSIS — T781XXD Other adverse food reactions, not elsewhere classified, subsequent encounter: Secondary | ICD-10-CM

## 2024-03-14 DIAGNOSIS — X30XXXA Exposure to excessive natural heat, initial encounter: Secondary | ICD-10-CM | POA: Diagnosis not present

## 2024-03-14 DIAGNOSIS — Z79899 Other long term (current) drug therapy: Secondary | ICD-10-CM | POA: Insufficient documentation

## 2024-03-14 LAB — BASIC METABOLIC PANEL WITH GFR
Anion gap: 15 (ref 5–15)
Anion gap: 11 (ref 5–15)
BUN: 23 mg/dL — ABNORMAL HIGH (ref 6–20)
BUN: 28 mg/dL — ABNORMAL HIGH (ref 6–20)
CO2: 22 mmol/L (ref 22–32)
CO2: 24 mmol/L (ref 22–32)
Calcium: 10 mg/dL (ref 8.9–10.3)
Calcium: 8.7 mg/dL — ABNORMAL LOW (ref 8.9–10.3)
Chloride: 104 mmol/L (ref 98–111)
Chloride: 105 mmol/L (ref 98–111)
Creatinine, Ser: 1.82 mg/dL — ABNORMAL HIGH (ref 0.61–1.24)
Creatinine, Ser: 1.66 mg/dL — ABNORMAL HIGH (ref 0.61–1.24)
GFR, Estimated: 46 mL/min — ABNORMAL LOW (ref >60)
GFR, Estimated: 52 mL/min — ABNORMAL LOW (ref >60)
Glucose, Bld: 117 mg/dL — ABNORMAL HIGH (ref 70–99)
Glucose, Bld: 131 mg/dL — ABNORMAL HIGH (ref 70–99)
Potassium: 4.9 mmol/L (ref 3.5–5.1)
Potassium: 4.6 mmol/L (ref 3.5–5.1)
Sodium: 141 mmol/L (ref 135–145)
Sodium: 140 mmol/L (ref 135–145)

## 2024-03-14 LAB — CBC
HCT: 49.7 % (ref 39.0–52.0)
Hemoglobin: 17 g/dL (ref 13.0–17.0)
MCH: 32.4 pg (ref 26.0–34.0)
MCHC: 34.2 g/dL (ref 30.0–36.0)
MCV: 94.8 fL (ref 80.0–100.0)
Platelets: 267 K/uL (ref 150–400)
RBC: 5.24 MIL/uL (ref 4.22–5.81)
RDW: 12.4 % (ref 11.5–15.5)
WBC: 15.7 K/uL — ABNORMAL HIGH (ref 4.0–10.5)
nRBC: 0 % (ref 0.0–0.2)

## 2024-03-14 LAB — HEPATIC FUNCTION PANEL
ALT: 20 U/L (ref 0–44)
AST: 32 U/L (ref 15–41)
Albumin: 4.6 g/dL (ref 3.5–5.0)
Alkaline Phosphatase: 62 U/L (ref 38–126)
Bilirubin, Direct: 0.2 mg/dL (ref 0.0–0.2)
Indirect Bilirubin: 0.5 mg/dL (ref 0.3–0.9)
Total Bilirubin: 0.7 mg/dL (ref 0.0–1.2)
Total Protein: 8.6 g/dL — ABNORMAL HIGH (ref 6.5–8.1)

## 2024-03-14 LAB — URINALYSIS, ROUTINE W REFLEX MICROSCOPIC
Bacteria, UA: NONE SEEN
Glucose, UA: NEGATIVE mg/dL
Hgb urine dipstick: NEGATIVE
Ketones, ur: NEGATIVE mg/dL
Nitrite: NEGATIVE
Protein, ur: 30 mg/dL — AB
Specific Gravity, Urine: 1.029 (ref 1.005–1.030)
WBC, UA: >50 WBC/hpf (ref 0–5)
pH: 5 (ref 5.0–8.0)

## 2024-03-14 LAB — TROPONIN T, HIGH SENSITIVITY
Troponin T High Sensitivity: 16 ng/L (ref <19)
Troponin T High Sensitivity: <15 ng/L (ref <19)

## 2024-03-14 LAB — LIPASE, BLOOD: Lipase: 41 U/L (ref 11–51)

## 2024-03-14 LAB — CK: Total CK: 392 U/L (ref 49–397)

## 2024-03-14 MED ORDER — FENTANYL CITRATE PF 50 MCG/ML IJ SOSY
50.0000 ug | PREFILLED_SYRINGE | Freq: Once | INTRAMUSCULAR | Status: DC
  Filled 2024-03-14: qty 1

## 2024-03-14 MED ORDER — ONDANSETRON HCL 4 MG/2ML IJ SOLN
4.0000 mg | Freq: Once | INTRAMUSCULAR | Status: AC
  Administered 2024-03-14: 4 mg via INTRAVENOUS
  Filled 2024-03-14: qty 2

## 2024-03-14 MED ORDER — ACETAMINOPHEN 325 MG PO TABS
650.0000 mg | ORAL_TABLET | Freq: Once | ORAL | Status: AC
  Administered 2024-03-14: 650 mg via ORAL
  Filled 2024-03-14: qty 2

## 2024-03-14 MED ORDER — SODIUM CHLORIDE 0.9 % IV BOLUS
1000.0000 mL | Freq: Once | INTRAVENOUS | Status: AC
  Administered 2024-03-14: 1000 mL via INTRAVENOUS

## 2024-03-14 NOTE — Discharge Instructions (Signed)
 Home to rest and hydrate as discussed. Recheck with your Worker's Comp. provider tomorrow.  Please take your labs with you to review.

## 2024-03-14 NOTE — ED Triage Notes (Signed)
 States increased dizziness and central CP that started around 1400 today. States he was working in the heat all day and is concerned about heat stroke

## 2024-03-14 NOTE — ED Provider Notes (Signed)
 [place] EMERGENCY DEPARTMENT AT [facility] Provider Note   CSN: [id number] Arrival date & time: 03/14/24  1634     Patient presents with: Dizziness   Justin Wilson is a 43 y.o. male.   43 year old male presents with concern for vomiting and body aches, heat exposure. 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 He began to feel overheated, nauseous with vomiting, cold chills and sweats, body aches, epigastric pain. Patient went to medical services with his work and was sent to the ER.        Prior to Admission medications   Medication Sig Start Date End Date Taking? Authorizing Provider  amLODipine  (NORVASC ) 5 MG tablet Take 1 tablet (5 mg total) by mouth daily. 02/18/24   de Peru, Quintin PARAS, MD  EPINEPHrine  0.3 mg/0.3 mL IJ SOAJ injection Inject 0.3 mg into the muscle as needed for anaphylaxis. 02/15/24   Doretha Folks, MD  ketorolac  (TORADOL ) 10 MG tablet Take 1 tablet (10 mg total) by mouth every 6 (six) hours as needed. 02/22/24   Cleotilde Rogue, MD    Allergies: Beef allergy     Review of Systems Negaitve except as per HPI Updated Vital Signs BP 112/71   Pulse 82   Temp 98.5 F (36.9 C) (Oral)   Resp 17   SpO2 93%   Physical Exam Vitals and nursing note reviewed.  Constitutional:      General: He is not in acute distress.    Appearance: He is well-developed. He is obese. He is not diaphoretic.  HENT:     Head: Normocephalic and atraumatic.     Mouth/Throat:     Mouth: Mucous membranes are dry.  Eyes:     Conjunctiva/sclera: Conjunctivae normal.  Cardiovascular:     Rate and Rhythm: Normal rate and regular rhythm.     Heart sounds: Normal heart sounds.  Pulmonary:     Effort: Pulmonary effort is normal.     Breath sounds: Normal breath sounds.  Abdominal:     Palpations: Abdomen is soft.     Tenderness: There is no abdominal tenderness.  Musculoskeletal:     Cervical back: Neck supple.      Right lower leg: No edema.     Left lower leg: No edema.  Skin:    General: Skin is warm and dry.     Findings: No erythema or rash.  Neurological:     Mental Status: He is alert and oriented to person, place, and time.  Psychiatric:        Behavior: Behavior normal.     (all labs ordered are listed, but only abnormal results are displayed) Labs Reviewed  BASIC METABOLIC PANEL WITH GFR - Abnormal; Notable for the following components:      Result Value   Glucose, Bld 117 (*)    BUN 23 (*)    Creatinine, Ser 1.82 (*)    GFR, Estimated 46 (*)    All other components within normal limits  CBC - Abnormal; Notable for the following components:   WBC 15.7 (*)    All other components within normal limits  URINALYSIS, ROUTINE W REFLEX MICROSCOPIC - Abnormal; Notable for the following components:   APPearance HAZY (*)    Bilirubin Urine SMALL (*)    Protein, ur 30 (*)    Leukocytes,Ua LARGE (*)    All other components within normal limits  HEPATIC FUNCTION PANEL - Abnormal; Notable for  the following components:   Total Protein 8.6 (*)    All other components within normal limits  BASIC METABOLIC PANEL WITH GFR - Abnormal; Notable for the following components:   Glucose, Bld 131 (*)    BUN 28 (*)    Creatinine, Ser 1.66 (*)    Calcium 8.7 (*)    GFR, Estimated 52 (*)    All other components within normal limits  CK  LIPASE, BLOOD  GC/CHLAMYDIA PROBE AMP ([place]) NOT AT [facility]  TROPONIN T, HIGH SENSITIVITY  TROPONIN T, HIGH SENSITIVITY    EKG: None  Radiology: DG Chest 2 View Result Date: 03/14/2024 CLINICAL DATA:  CP EXAM: CHEST - 2 VIEW COMPARISON:  Chest x-ray 01/11/2021 FINDINGS: The heart and mediastinal contours are within normal limits. No focal consolidation. No pulmonary edema. No pleural effusion. No pneumothorax. No acute osseous abnormality. IMPRESSION: No active cardiopulmonary disease. Electronically Signed   By: Morgane  Naveau M.D.   On: 03/14/2024 18:16      Procedures   Medications Ordered in the ED  fentaNYL  (SUBLIMAZE ) injection 50 mcg (50 mcg Intravenous Patient Refused/Not Given 03/14/24 2047)  sodium chloride  0.9 % bolus 1,000 mL (0 mLs Intravenous Stopped 03/14/24 2146)  ondansetron  (ZOFRAN ) injection 4 mg (4 mg Intravenous Given 03/14/24 2017)  sodium chloride  0.9 % bolus 1,000 mL (0 mLs Intravenous Stopped 03/14/24 2236)  acetaminophen  (TYLENOL ) tablet 650 mg (650 mg Oral Given 03/14/24 2131)                                    Medical Decision Making Amount and/or Complexity of Data Reviewed Labs: ordered. Radiology: ordered.  Risk OTC drugs. Prescription drug management.   This patient presents to the ED for concern of dehydration, this involves an extensive number of treatment options, and is a complaint that carries with it a high risk of complications and morbidity.  The differential diagnosis includes but not limited to electrolyte disturbance, metabolic abnormality, rhabdomyolysis   Co morbidities / Chronic conditions that complicate the patient evaluation  Hypertension, prediabetes   Additional history obtained:  Additional history obtained from EMR External records from outside source obtained and reviewed including prior labs and history on file   Lab Tests:  I Ordered, and personally interpreted labs.  The pertinent results include: Urinalysis with large leukocytes, greater than 50 white cells, no bacteria, will send out GC chlamydia.  CK within normal is at 392.  Lipase is normal.  Hepatic function is unremarkable.  CBC with nonspecific leukocytosis at 15.7.  Initial creatinine is elevated at 1.8, downtrending to 1.6 on repeat.   Imaging Studies ordered:  I ordered imaging studies including chest x-ray I independently visualized and interpreted imaging which showed no acute process I agree with the radiologist interpretation   Cardiac Monitoring: / EKG:  The patient was maintained on a cardiac monitor.   I personally viewed and interpreted the cardiac monitored which showed an underlying rhythm of: Sinus tachycardia, rate 115   Problem List / ED Course / Critical interventions / Medication management  43 year old male presents for evaluation after concern for heat related illness.  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  While at work today, became very hot, nauseous and was sweating, had chills at times had vomiting and diarrhea.  Patient went to his employee health who directed him to the emergency room for  further evaluation.  Labs were assessed in triage, found of AKI with increasing creatinine from 0.8 at baseline to 1.8.  He is provided with IV fluids and antiemetics.  He is no longer vomiting, his kidney function is improving and he is feeling well.  Patient is discharged with a copy of his paperwork to take back to employee health for review.  Recommend home to rest and hydrate with hydrating fluids such as Gatorade.  Recommend lab recheck and not returning to full duty without restrictions until cleared by workers comp with improving kidney function. I ordered medication including IV fluids, Tylenol , Zofran  Reevaluation of the patient after these medicines showed that the patient improved I have reviewed the patients home medicines and have made adjustments as needed   Social Determinants of Health:  Has PCP, lives with family   Test / Admission - Considered:  Stable for discharge      Final diagnoses:  Dehydration  AKI (acute kidney injury) [facility])    ED Discharge Orders     None          Beverley Leita DELENA DEVONNA 03/14/24 2328    Albertina Dixon, MD 03/15/24 1733

## 2024-03-16 ENCOUNTER — Ambulatory Visit (HOSPITAL_BASED_OUTPATIENT_CLINIC_OR_DEPARTMENT_OTHER)

## 2024-03-16 ENCOUNTER — Ambulatory Visit (HOSPITAL_BASED_OUTPATIENT_CLINIC_OR_DEPARTMENT_OTHER): Admitting: Family Medicine

## 2024-03-16 ENCOUNTER — Encounter (HOSPITAL_BASED_OUTPATIENT_CLINIC_OR_DEPARTMENT_OTHER): Payer: Self-pay | Admitting: Family Medicine

## 2024-03-16 VITALS — BP 135/86 | HR 83 | Ht 73.0 in | Wt 335.0 lb

## 2024-03-16 DIAGNOSIS — Z0000 Encounter for general adult medical examination without abnormal findings: Secondary | ICD-10-CM

## 2024-03-16 DIAGNOSIS — M79671 Pain in right foot: Secondary | ICD-10-CM

## 2024-03-16 LAB — GC/CHLAMYDIA PROBE AMP (~~LOC~~) NOT AT ARMC
Chlamydia: NEGATIVE
Comment: NEGATIVE
Comment: NORMAL
Neisseria Gonorrhea: NEGATIVE

## 2024-03-16 NOTE — Assessment & Plan Note (Signed)
 New issue per patient, has been present for a few weeks now.  Does not recall specific injury.  Does have prominence of her central part of dorsal surface of foot.  Does have some mild tenderness with this.  No fluctuance appreciated We can proceed with x-rays today as well as referral to podiatry

## 2024-03-16 NOTE — Progress Notes (Signed)
 Subjective:    CC: Annual Physical Exam  HPI:  Justin Wilson is a 43 y.o. presenting for annual physical  I reviewed the past medical history, family history, social history, surgical history, and allergies today and no changes were needed.  Please see the problem list section below in epic for further details.  Past Medical History: Past Medical History:  Diagnosis Date   Allergy  6/17   Anxiety 40m   Depression 79m   Hypertension    Sleep apnea 2006   Past Surgical History: History reviewed. No pertinent surgical history. Social History: Social History   Socioeconomic History   Marital status: Single    Spouse name: Not on file   Number of children: Not on file   Years of education: Not on file   Highest education level: GED or equivalent  Occupational History   Occupation: Pensions consultant    Comment: Warehouse  Tobacco Use   Smoking status: Some Days    Current packs/day: 1.00    Average packs/day: 1 pack/day for 10.0 years (10.0 ttl pk-yrs)    Types: Cigarettes    Passive exposure: Current   Smokeless tobacco: Never  Vaping Use   Vaping status: Never Used  Substance and Sexual Activity   Alcohol use: Not Currently   Drug use: Not Currently    Types: Marijuana    Comment: recently quit about 6 months ago   Sexual activity: Yes    Birth control/protection: Condom  Other Topics Concern   Not on file  Social History Narrative   Not on file   Social Drivers of Health   Financial Resource Strain: High Risk (03/15/2024)   Overall Financial Resource Strain (CARDIA)    Difficulty of Paying Living Expenses: Very hard  Food Insecurity: Food Insecurity Present (03/15/2024)   Hunger Vital Sign    Worried About Running Out of Food in the Last Year: Sometimes true    Ran Out of Food in the Last Year: Sometimes true  Transportation Needs: No Transportation Needs (03/15/2024)   PRAPARE - Administrator, Civil Service (Medical): No    Lack of  Transportation (Non-Medical): No  Physical Activity: Sufficiently Active (03/15/2024)   Exercise Vital Sign    Days of Exercise per Week: 4 days    Minutes of Exercise per Session: 90 min  Stress: Stress Concern Present (03/15/2024)   [employer] of Occupational Health - Occupational Stress Questionnaire    Feeling of Stress: Very much  Social Connections: Moderately Integrated (03/15/2024)   Social Connection and Isolation Panel    Frequency of Communication with Friends and Family: Three times a week    Frequency of Social Gatherings with Friends and Family: Twice a week    Attends Religious Services: 1 to 4 times per year    Active Member of [employer] or Organizations: No    Attends Engineer, structural: Not on file    Marital Status: Living with partner   Family History: Family History  Problem Relation Age of Onset   Benign prostatic hyperplasia Maternal Grandfather        he had something with his prostate   Anxiety disorder Son    Allergic rhinitis Neg Hx    Angioedema Neg Hx    Asthma Neg Hx    Urticaria Neg Hx    Allergies: Allergies  Allergen Reactions   Beef Allergy      anaphylaxis   Medications: See med rec.  Review of Systems: No headache, visual  changes, nausea, vomiting, diarrhea, constipation, dizziness, abdominal pain, skin rash, fevers, chills, night sweats, swollen lymph nodes, weight loss, chest pain, body aches, joint swelling, muscle aches, shortness of breath, mood changes, visual or auditory hallucinations.  Objective:    BP 135/86 (BP Location: Right Arm, Patient Position: Sitting, Cuff Size: Large)   Pulse 83   Ht 6' 1 (1.854 m)   Wt (!) 335 lb (152 kg)   SpO2 97%   BMI 44.20 kg/m   General: Well Developed, well nourished, and in no acute distress. Neuro: Alert and oriented x3, extra-ocular muscles intact, sensation grossly intact. Cranial nerves II through XII are intact, motor, sensory, and coordinative functions are all  intact. HEENT: Normocephalic, atraumatic, pupils equal round reactive to light, neck supple, no masses, no lymphadenopathy, thyroid nonpalpable. Oropharynx, nasopharynx, external ear canals are unremarkable. Skin: Warm and dry, no rashes noted. Cardiac: Regular rate and rhythm, no murmurs rubs or gallops. Respiratory: Clear to auscultation bilaterally. Not using accessory muscles, speaking in full sentences. Abdominal: Soft, nontender, nondistended, positive bowel sounds, no masses, no organomegaly. Musculoskeletal: Shoulder, elbow, wrist, hip, knee, ankle stable, and with full range of motion.  Impression and Recommendations:    Wellness examination Assessment & Plan: Routine HCM labs ordered. HCM reviewed/discussed. Anticipatory guidance regarding healthy weight, lifestyle and choices given. Recommend healthy diet.  Recommend approximately 150 minutes/week of moderate intensity exercise Recommend regular dental and vision exams Always use seatbelt/lap and shoulder restraints Recommend using smoke alarms and checking batteries at least twice a year Recommend using sunscreen when outside Discussed immunization recommendations  Orders: -     CBC with Differential/Platelet; Future -     Comprehensive metabolic panel with GFR; Future -     Hemoglobin A1c; Future -     Lipid panel; Future -     TSH Rfx on Abnormal to Free T4; Future  Foot pain, right Assessment & Plan: New issue per patient, has been present for a few weeks now.  Does not recall specific injury.  Does have prominence of her central part of dorsal surface of foot.  Does have some mild tenderness with this.  No fluctuance appreciated We can proceed with x-rays today as well as referral to podiatry  Orders: -     DG Foot Complete Right; Future -     Ambulatory referral to Podiatry  Blood pressure is better controlled in office today, currently taking medication as prescribed.  Not checking blood pressure at home  currently Recommend intermittent monitoring blood pressure home, DASH diet.  No changes to medication regimen today  Did have recent elevation in serum creatinine during recent ED visit.  Will recheck this with upcoming labs  Return in about 2 months (around 05/17/2024) for hypertension.   ___________________________________________ Brindley Madarang de Peru, MD, ABFM, CAQSM Primary Care and Sports Medicine [facility]

## 2024-03-16 NOTE — Assessment & Plan Note (Signed)

## 2024-03-16 NOTE — Patient Instructions (Signed)
  Medication Instructions:  Your physician recommends that you continue on your current medications as directed. Please refer to the Current Medication list given to you today. --If you need a refill on any your medications before your next appointment, please call your pharmacy first. If no refills are authorized on file call the office.-- Lab Work: Your physician has recommended that you have lab work today: 1 week  If you have labs (blood work) drawn today and your tests are completely normal, you will receive your results via MyChart message OR a phone call from our staff.  Please ensure you check your voicemail in the event that you authorized detailed messages to be left on a delegated number. If you have any lab test that is abnormal or we need to change your treatment, we will call you to review the results.   Follow-Up: Your next appointment:   Your physician recommends that you schedule a follow-up appointment in: 2 months follow up  with Dr. de Peru  You will receive a text message or e-mail with a link to a survey about your care and experience with us  today! We would greatly appreciate your feedback!   Thanks for letting us  be apart of your health journey!!  Primary Care and Sports Medicine   Dr. Quintin sheerer Peru   We encourage you to activate your patient portal called MyChart.  Sign up information is provided on this After Visit Summary.  MyChart is used to connect with patients for Virtual Visits (Telemedicine).  Patients are able to view lab/test results, encounter notes, upcoming appointments, etc.  Non-urgent messages can be sent to your provider as well. To learn more about what you can do with MyChart, please visit --  [email or web address].

## 2024-03-17 NOTE — Telephone Encounter (Signed)
 Please call patient and have him schedule for first shot appointment for the stinging insects and go over protocol.  Regarding the beef - I'm not sure why it's on his allergy  list as I don't recall putting it on his list and I didn't do any work up for that.  Does he have problems with beef or any other red meat?  Thank you.

## 2024-03-21 ENCOUNTER — Ambulatory Visit (INDEPENDENT_AMBULATORY_CARE_PROVIDER_SITE_OTHER): Admitting: Sleep Medicine

## 2024-03-21 ENCOUNTER — Encounter: Payer: Self-pay | Admitting: Sleep Medicine

## 2024-03-21 VITALS — BP 120/80 | HR 64 | Temp 97.9°F | Ht 73.0 in | Wt 333.4 lb

## 2024-03-21 DIAGNOSIS — G4733 Obstructive sleep apnea (adult) (pediatric): Secondary | ICD-10-CM | POA: Diagnosis not present

## 2024-03-21 DIAGNOSIS — I10 Essential (primary) hypertension: Secondary | ICD-10-CM | POA: Diagnosis not present

## 2024-03-21 DIAGNOSIS — F17210 Nicotine dependence, cigarettes, uncomplicated: Secondary | ICD-10-CM

## 2024-03-21 DIAGNOSIS — G4700 Insomnia, unspecified: Secondary | ICD-10-CM

## 2024-03-21 DIAGNOSIS — F5104 Psychophysiologic insomnia: Secondary | ICD-10-CM

## 2024-03-21 MED ORDER — TRAZODONE HCL 50 MG PO TABS: 50.0000 mg | ORAL_TABLET | Freq: Every day | ORAL | 3 refills | Status: DC

## 2024-03-21 NOTE — Patient Instructions (Signed)
 SABRA

## 2024-03-21 NOTE — Progress Notes (Signed)
 Name:Justin Wilson MRN: [id number] DOB: [birth year]   CHIEF COMPLAINT:  EXCESSIVE DAYTIME SLEEPINESS   HISTORY OF PRESENT ILLNESS:  Justin Wilson is a 43 y.o. w/ a h/o HTN and morbid obesity who presents for c/o loud snoring, witnessed apnea and excessive daytime sleepiness which has been present for several years. Reports nocturnal awakenings due to unclear reasons and has difficulty falling back to sleep. Reports significant weight changes. Admits to dry mouth. Denies morning headaches, RLS symptoms, dream enactment, cataplexy, hypnagogic or hypnapompic hallucinations. Denies a family history of sleep apnea. Denies drowsy driving. Drinks 1-2 energy drinks daily, denies alcohol or illicit drug use. Smokes 1/2 ppd tobacco use daily.   Bedtime 9:30-11 pm Sleep onset 45 mins Rise time 5:15 am   EPWORTH SLEEP SCORE 12    03/21/2024    9:00 AM  Results of the Epworth flowsheet  Sitting and reading 2  Watching TV 2  Sitting, inactive in a public place (e.g. a theatre or a meeting) 1  As a passenger in a car for an hour without a break 3  Lying down to rest in the afternoon when circumstances permit 3  Sitting and talking to someone 1  Sitting quietly after a lunch without alcohol 0  In a car, while stopped for a few minutes in traffic 0  Total score 12    PAST MEDICAL HISTORY :   has a past medical history of Allergy  (6/17), Anxiety (75m), Depression (54m), Hypertension, and Sleep apnea (2006).  has no past surgical history on file. Prior to Admission medications   Medication Sig Start Date End Date Taking? Authorizing Provider  amLODipine  (NORVASC ) 5 MG tablet Take 1 tablet (5 mg total) by mouth daily. 02/18/24  Yes de Peru, Raymond J, MD  EPINEPHrine  0.3 mg/0.3 mL IJ SOAJ injection Inject 0.3 mg into the muscle as needed for anaphylaxis. 02/15/24  Yes Doretha Folks, MD  ketorolac  (TORADOL ) 10 MG tablet Take 1 tablet (10 mg total) by mouth every 6 (six) hours as needed.  02/22/24  Yes Cleotilde Rogue, MD   Allergies  Allergen Reactions   Bee Venom Anaphylaxis    Itchy, burning, hives, anaphylaxis.   Fire  Ant Anaphylaxis    Itchy, burning, hives, anaphylaxis.   Wasp Venom Anaphylaxis    Itchy, burning, hives, anaphylaxis.   Beef Allergy      anaphylaxis    FAMILY HISTORY:  family history includes Anxiety disorder in his son; Benign prostatic hyperplasia in his maternal grandfather. SOCIAL HISTORY:  reports that he has been smoking cigarettes. He has a 10 pack-year smoking history. He has been exposed to tobacco smoke. He has never used smokeless tobacco. He reports that he does not currently use alcohol. He reports that he does not currently use drugs after having used the following drugs: Marijuana.   Review of Systems:  Gen:  Denies  fever, sweats, chills weight loss  HEENT: Denies blurred vision, double vision, ear pain, eye pain, hearing loss, nose bleeds, sore throat Cardiac:  No dizziness, chest pain or heaviness, chest tightness,edema, No JVD Resp:   No cough, -sputum production, -shortness of breath,-wheezing, -hemoptysis,  Gi: Denies swallowing difficulty, stomach pain, nausea or vomiting, diarrhea, constipation, bowel incontinence Gu:  Denies bladder incontinence, burning urine Ext:   Denies Joint pain, stiffness or swelling Skin: Denies  skin rash, easy bruising or bleeding or hives Endoc:  Denies polyuria, polydipsia , polyphagia or weight change Psych:   Denies depression, insomnia or hallucinations  Other:  All other systems negative  VITAL SIGNS: BP 120/80 (BP Location: Right Arm, Patient Position: Sitting, Cuff Size: Large)   Pulse 64   Temp 97.9 F (36.6 C) (Oral)   Ht 6' 1 (1.854 m)   Wt (!) 333 lb 6.4 oz (151.2 kg)   SpO2 97%   BMI 43.99 kg/m    Physical Examination:   General Appearance: No distress  EYES PERRLA, EOM intact.   NECK Supple, No JVD Pulmonary: normal breath sounds, No wheezing.  CardiovascularNormal  S1,S2.  No m/r/g.   Abdomen: Benign, Soft, non-tender. Skin:   warm, no rashes, no ecchymosis  Extremities: normal, no cyanosis, clubbing. Neuro:without focal findings,  speech normal  PSYCHIATRIC: Mood, affect within normal limits.   ASSESSMENT AND PLAN  OSA I suspect that OSA is likely present due to clinical presentation. Discussed the consequences of untreated sleep apnea. Advised not to drive drowsy for safety of patient and others. Will complete further evaluation with a home sleep study and follow up to review results.    HTN Stable, on current management. Following with PCP.   Morbid obesity  Counseled patient on diet and lifestyle modification.  Insomnia Counseled patient on stimulus control and improving sleep hygiene practices. Will also try patient on Trazodone  50 mg nightly.    MEDICATION ADJUSTMENTS/LABS AND TESTS ORDERED: Recommend Sleep Study   Patient  satisfied with Plan of action and management. All questions answered  Follow up to review HST results and treatment plan.   I spent a total of 51 minutes reviewing chart data, face-to-face evaluation with the patient, counseling and coordination of care as detailed above.    Hamilton Marinello, M.D.  Sleep Medicine [facility]

## 2024-03-22 ENCOUNTER — Ambulatory Visit (HOSPITAL_BASED_OUTPATIENT_CLINIC_OR_DEPARTMENT_OTHER): Payer: Self-pay | Admitting: Family Medicine

## 2024-03-30 ENCOUNTER — Ambulatory Visit (INDEPENDENT_AMBULATORY_CARE_PROVIDER_SITE_OTHER)

## 2024-03-30 ENCOUNTER — Ambulatory Visit: Admitting: Podiatry

## 2024-03-30 ENCOUNTER — Encounter: Payer: Self-pay | Admitting: Podiatry

## 2024-03-30 DIAGNOSIS — M898X7 Other specified disorders of bone, ankle and foot: Secondary | ICD-10-CM

## 2024-03-30 DIAGNOSIS — M25571 Pain in right ankle and joints of right foot: Secondary | ICD-10-CM

## 2024-03-30 DIAGNOSIS — M67471 Ganglion, right ankle and foot: Secondary | ICD-10-CM

## 2024-03-30 DIAGNOSIS — M79671 Pain in right foot: Secondary | ICD-10-CM

## 2024-03-30 MED ORDER — TRIAMCINOLONE ACETONIDE 10 MG/ML IJ SUSP
10.0000 mg | Freq: Once | INTRAMUSCULAR | Status: AC
  Administered 2024-03-30: 10 mg

## 2024-03-30 NOTE — Progress Notes (Signed)
 Patient presents with complaint of pain in the right foot pain on the dorsal aspect of the foot and sometimes around the rear foot also.  I do recall any specific injury to it.  Has been painful for a while but up to getting worse lately.  Has noticed swelling on top of the right foot.  Has not noticed any redness or ecchymosis.   Physical exam:  General appearance: Pleasant, and in no acute distress. AOx3.  Vascular: Pedal pulses: DP 2/4 bilaterally, PT 2/4 bilaterally.  Mild edema lower legs bilaterally. Capillary fill time immediate bilaterally.  Neurological: Light touch intact feet bilaterally.  Normal Achilles reflex bilaterally.  No clonus or spasticity noted.  Negative Tinel's sign dorsal cutaneous nerves foot and ankle right  Dermatologic:   Skin normal temperature bilaterally.  Skin normal color, tone, and texture bilaterally.   Musculoskeletal: Tenderness at the second tarsometatarsal joint with osteophytic changes palpable right.  Tenderness with range of motion of the second TMT.  Decreased range of motion at the subtalar joint right.  No tenderness along the ankle joint proper.  Some tenderness in the sinus tarsi right.  Radiographs: 3 views foot right: Joint space narrowing and osteophytic changes of the tarsal metatarsal joint 2.  No sign of fractures or dislocations.  Some sclerotic changes in the subtalar joint but no evidence of any coalitions.  Normal bone density.  No evidence any bone tumors.  Diagnosis: 1.  Pain foot right. 2.  Arthralgia second tarsal metatarsal joint right 3.  Exostosis midtarsal joints right  Plan: -New patient office visit for evaluation and management.  Level 3.  Modifier 25. - Discussed with him the arthritis in the midfoot particularly at the second TMT right.  Discussed with the etiology and treatment.  Will try an injection today.  Told wear good supportive shoes avoid flat soled shoes or shoes with no support. -OTC insoles feet  bilaterally -injected 3cc 2:1 mixture 0.5 cc Marcaine:Kenolog 10mg /62ml at second tarsometatarsal joint right.    Return 2 weeks follow-up injection TMT to right

## 2024-04-02 ENCOUNTER — Encounter

## 2024-04-02 DIAGNOSIS — G4733 Obstructive sleep apnea (adult) (pediatric): Secondary | ICD-10-CM

## 2024-04-07 ENCOUNTER — Other Ambulatory Visit: Payer: Self-pay | Admitting: Allergy

## 2024-04-07 DIAGNOSIS — Z91038 Other insect allergy status: Secondary | ICD-10-CM

## 2024-04-07 NOTE — Progress Notes (Signed)
 VIALS TO BE MADE 04-11-24

## 2024-04-07 NOTE — Progress Notes (Signed)
 Aeroallergen Immunotherapy  Ordering Provider: Dr. Orlan Cramp  Patient Details Name: KYAN YURKOVICH MRN: [id number] Date of Birth: 05/06/1981  Order 1 of 1  Vial Label: Fire  ant  0.5 ml (Volume)  1:20 Concentration -- Fire  Ant   0.5  ml Extract Subtotal 4.5  ml Diluent  5.0  ml Maintenance Total  Schedule:  B Silver Vial (1:1,000,000): Schedule B (6 doses) Blue Vial (1:100,000): Schedule B (6 doses) Yellow Vial (1:10,000): Schedule B (6 doses) Green Vial (1:1,000): Schedule B (6 doses) Red Vial (1:100): Schedule A (14 doses)  Special Instructions: come in once a week. Also on mixed vespid + wasp injection. Once on red vial #1 0.5cc go every 2 weeks, on red vial #2 0.5cc go every 4 weeks. May build up red vials faster (0.1, 0.3, 0.5).

## 2024-04-11 DIAGNOSIS — T63421D Toxic effect of venom of ants, accidental (unintentional), subsequent encounter: Secondary | ICD-10-CM | POA: Diagnosis not present

## 2024-04-13 NOTE — Addendum Note (Signed)
 Addended by: Otniel Hoe M on: 04/13/2024 04:40 PM   Modules accepted: Orders

## 2024-04-13 NOTE — Telephone Encounter (Signed)
 Please mail beef labs to patient's home. Thank you.

## 2024-04-14 NOTE — Telephone Encounter (Signed)
 Lab requisition forms have been placed to be mailed out to the address on file.

## 2024-04-20 ENCOUNTER — Ambulatory Visit

## 2024-04-20 ENCOUNTER — Ambulatory Visit: Admitting: Podiatry

## 2024-04-20 DIAGNOSIS — Z91038 Other insect allergy status: Secondary | ICD-10-CM

## 2024-04-20 DIAGNOSIS — M25571 Pain in right ankle and joints of right foot: Secondary | ICD-10-CM | POA: Diagnosis not present

## 2024-04-20 MED ORDER — PREDNISONE 5 MG PO TABS
ORAL_TABLET | ORAL | 0 refills | Status: DC
Start: 2024-04-20 — End: 2024-05-18

## 2024-04-20 NOTE — Progress Notes (Signed)
 Patient presents today follow-up injection second TMT right.  Says feeling much better probably about 70 to 80% better.  Still little bit sore but not really sharply painful like it was before.   Physical exam:  General appearance: Pleasant, and in no acute distress. AOx3.  Vascular: Pedal pulses: DP 2/4 bilaterally, PT 2/4 bilaterally.  Mild to moderate edema lower legs bilaterally. Capillary fill time immediate.  Neurological: Grossly intact bilaterally   dermatologic:   Skin normal temperature bilaterally.  Skin normal color, tone, and texture bilaterally.   Musculoskeletal: Some soreness over the second TMT right.  Slight soreness with range of motion of the second TMT.   Diagnosis: 1.  Arthralgia second tarsometatarsal joint right  Plan: -Established office visit for evaluation and management level 3. - Continue wearing good supportive shoe avoid wearing flat soled shoes or going in bare feet. - Rx prednisone  5 mg, 30 mg p.o. daily first day, then decrease by 5 mg every other day for 12 days   Return as needed

## 2024-04-26 ENCOUNTER — Ambulatory Visit: Payer: Self-pay

## 2024-04-26 DIAGNOSIS — R069 Unspecified abnormalities of breathing: Secondary | ICD-10-CM | POA: Diagnosis not present

## 2024-04-26 DIAGNOSIS — G4733 Obstructive sleep apnea (adult) (pediatric): Secondary | ICD-10-CM

## 2024-04-27 ENCOUNTER — Ambulatory Visit (INDEPENDENT_AMBULATORY_CARE_PROVIDER_SITE_OTHER)

## 2024-04-27 DIAGNOSIS — Z91038 Other insect allergy status: Secondary | ICD-10-CM

## 2024-05-03 NOTE — Telephone Encounter (Addendum)
 LMTCB. E2C2 please advise when patient calls back.

## 2024-05-04 ENCOUNTER — Ambulatory Visit (INDEPENDENT_AMBULATORY_CARE_PROVIDER_SITE_OTHER)

## 2024-05-04 ENCOUNTER — Other Ambulatory Visit: Payer: Self-pay | Admitting: Podiatry

## 2024-05-04 ENCOUNTER — Telehealth: Payer: Self-pay | Admitting: Lab

## 2024-05-04 DIAGNOSIS — Z91038 Other insect allergy status: Secondary | ICD-10-CM | POA: Diagnosis not present

## 2024-05-04 MED ORDER — PREDNISONE 5 MG PO TABS
ORAL_TABLET | ORAL | 0 refills | Status: DC
Start: 2024-05-04 — End: 2024-05-18

## 2024-05-04 NOTE — Progress Notes (Signed)
 Justin Wilson

## 2024-05-04 NOTE — Telephone Encounter (Signed)
 Patient states prednisone  was to be called in but pharmacy doesn't have it please follow up on this asap.

## 2024-05-11 ENCOUNTER — Ambulatory Visit (INDEPENDENT_AMBULATORY_CARE_PROVIDER_SITE_OTHER)

## 2024-05-11 DIAGNOSIS — Z91038 Other insect allergy status: Secondary | ICD-10-CM

## 2024-05-18 ENCOUNTER — Encounter (HOSPITAL_BASED_OUTPATIENT_CLINIC_OR_DEPARTMENT_OTHER): Payer: Self-pay | Admitting: Family Medicine

## 2024-05-18 ENCOUNTER — Ambulatory Visit (HOSPITAL_BASED_OUTPATIENT_CLINIC_OR_DEPARTMENT_OTHER): Admitting: Family Medicine

## 2024-05-18 VITALS — BP 134/86 | HR 80 | Ht 75.0 in | Wt 334.8 lb

## 2024-05-18 DIAGNOSIS — I10 Essential (primary) hypertension: Secondary | ICD-10-CM

## 2024-05-18 MED ORDER — AMLODIPINE BESYLATE 5 MG PO TABS: 7.5000 mg | ORAL_TABLET | Freq: Every day | ORAL | 1 refills | Status: DC

## 2024-05-18 NOTE — Assessment & Plan Note (Signed)
 Blood pressure elevated in office today.  He has been taking medication recently.  He denies experiencing any side effects or issues with amlodipine  in the past.  Was previously prescribed 7.5 mg dose. We discussed options and we can proceed with slight dose increase, we will increase to 7.5 mg.  Recommend intermittent monitoring of blood pressure at home, DASH diet.

## 2024-05-18 NOTE — Patient Instructions (Signed)
  Medication Instructions:  Your physician recommends that you continue on your current medications as directed. Please refer to the Current Medication list given to you today. --If you need a refill on any your medications before your next appointment, please call your pharmacy first. If no refills are authorized on file call the office.--   Follow-Up: Your next appointment:   Your physician recommends that you schedule a follow-up appointment in: 2 month follow up  with Dr. de [place]  You will receive a text message or e-mail with a link to a survey about your care and experience with [place] today! We would greatly appreciate your feedback!   Thanks for letting us be apart of your health journey!!  Primary Care and Sports Medicine   Dr. Ceasar Mons [place]   We encourage you to activate your patient portal called "MyChart".  Sign up information is provided on this After Visit Summary.  MyChart is used to connect with patients for Virtual Visits (Telemedicine).  Patients are able to view lab/test results, encounter notes, upcoming appointments, etc.  Non-urgent messages can be sent to your provider as well. To learn more about what you can do with MyChart, please visit --  [email or web address].

## 2024-05-18 NOTE — Progress Notes (Signed)
    Procedures performed today:    None.  Independent interpretation of notes and tests performed by another provider:   None.  Brief History, Exam, Impression, and Recommendations:    BP 134/86 (BP Location: Left Arm, Patient Position: Sitting, Cuff Size: Large)   Pulse 80   Ht 6' 3 (1.905 m)   Wt (!) 334 lb 12.8 oz (151.9 kg)   SpO2 97%   BMI 41.85 kg/m   Primary hypertension Assessment & Plan: Blood pressure elevated in office today.  He has been taking medication recently.  He denies experiencing any side effects or issues with amlodipine  in the past.  Was previously prescribed 7.5 mg dose. We discussed options and we can proceed with slight dose increase, we will increase to 7.5 mg.  Recommend intermittent monitoring of blood pressure at home, DASH diet.  Orders: -     amLODIPine  Besylate; Take 1.5 tablets (7.5 mg total) by mouth daily.  Dispense: 135 tablet; Refill: 1  Return in about 2 months (around 07/18/2024) for hypertension.   ___________________________________________ Will Schier de [place], MD, ABFM, CAQSM Primary Care and Sports Medicine [facility]

## 2024-05-25 ENCOUNTER — Telehealth: Payer: Self-pay | Admitting: Sleep Medicine

## 2024-05-25 NOTE — Telephone Encounter (Signed)
 LVMTCB to schedule CPAP compliance between 06/27/2024 to 08/26/2024.

## 2024-06-07 ENCOUNTER — Other Ambulatory Visit: Payer: Self-pay | Admitting: Family Medicine

## 2024-06-08 ENCOUNTER — Encounter (HOSPITAL_BASED_OUTPATIENT_CLINIC_OR_DEPARTMENT_OTHER): Payer: Self-pay | Admitting: *Deleted

## 2024-06-29 ENCOUNTER — Ambulatory Visit: Admitting: Sleep Medicine

## 2024-07-06 ENCOUNTER — Ambulatory Visit: Admitting: Sleep Medicine

## 2024-07-20 ENCOUNTER — Ambulatory Visit (HOSPITAL_BASED_OUTPATIENT_CLINIC_OR_DEPARTMENT_OTHER): Admitting: Family Medicine

## 2024-07-20 ENCOUNTER — Ambulatory Visit: Admitting: Sleep Medicine

## 2024-07-27 ENCOUNTER — Ambulatory Visit: Admitting: Sleep Medicine

## 2024-07-27 ENCOUNTER — Encounter: Payer: Self-pay | Admitting: Sleep Medicine

## 2024-07-27 VITALS — BP 150/98 | HR 78 | Temp 98.5°F | Ht 75.0 in | Wt 345.0 lb

## 2024-07-27 DIAGNOSIS — I10 Essential (primary) hypertension: Secondary | ICD-10-CM

## 2024-07-27 DIAGNOSIS — G4733 Obstructive sleep apnea (adult) (pediatric): Secondary | ICD-10-CM

## 2024-07-27 DIAGNOSIS — Z6841 Body Mass Index (BMI) 40.0 and over, adult: Secondary | ICD-10-CM

## 2024-07-27 DIAGNOSIS — F5104 Psychophysiologic insomnia: Secondary | ICD-10-CM | POA: Diagnosis not present

## 2024-07-27 MED ORDER — ZEPBOUND 2.5 MG/0.5ML ~~LOC~~ SOAJ: 2.5000 mg | SUBCUTANEOUS | 1 refills | Status: DC

## 2024-07-27 NOTE — Progress Notes (Signed)
 Name:Justin Wilson MRN: [id number] DOB: 16-Dec-1980   CHIEF COMPLAINT:  EXCESSIVE DAYTIME SLEEPINESS   HISTORY OF PRESENT ILLNESS:  Mr. Helget is a 43 y.o. w/ a h/o HTN and morbid obesity who presents for c/o loud snoring, witnessed apnea and excessive daytime sleepiness which has been present for several years. Reports nocturnal awakenings due to unclear reasons and has difficulty falling back to sleep. Reports significant weight changes. Admits to dry mouth. Denies morning headaches, RLS symptoms, dream enactment, cataplexy, hypnagogic or hypnapompic hallucinations. Denies a family history of sleep apnea. Denies drowsy driving. Drinks 1-2 energy drinks daily, denies alcohol or illicit drug use. Smokes 1/2 ppd tobacco use daily.   Bedtime 9:30-11 pm Sleep onset 45 mins Rise time 5:15 am   EPWORTH SLEEP SCORE 12    03/21/2024    9:00 AM  Results of the Epworth flowsheet  Sitting and reading 2  Watching TV 2  Sitting, inactive in a public place (e.g. a theatre or a meeting) 1  As a passenger in a car for an hour without a break 3  Lying down to rest in the afternoon when circumstances permit 3  Sitting and talking to someone 1  Sitting quietly after a lunch without alcohol 0  In a car, while stopped for a few minutes in traffic 0  Total score 12    PAST MEDICAL HISTORY :   has a past medical history of Allergy  (6/17), Anxiety (52m), Depression (79m), Hypertension, and Sleep apnea (2006).  has no past surgical history on file. Prior to Admission medications   Medication Sig Start Date End Date Taking? Authorizing Provider  amLODipine  (NORVASC ) 5 MG tablet Take 1 tablet (5 mg total) by mouth daily. 02/18/24  Yes de [place], Raymond J, MD  EPINEPHrine  0.3 mg/0.3 mL IJ SOAJ injection Inject 0.3 mg into the muscle as needed for anaphylaxis. 02/15/24  Yes Doretha Folks, MD  ketorolac  (TORADOL ) 10 MG tablet Take 1 tablet (10 mg total) by mouth every 6 (six) hours as needed.  02/22/24  Yes Cleotilde Rogue, MD   Allergies  Allergen Reactions   Bee Venom Anaphylaxis    Itchy, burning, hives, anaphylaxis.   Fire  Ant Anaphylaxis    Itchy, burning, hives, anaphylaxis.   Wasp Venom Anaphylaxis    Itchy, burning, hives, anaphylaxis.   Beef Allergy      anaphylaxis    FAMILY HISTORY:  family history includes Anxiety disorder in his son; Benign prostatic hyperplasia in his maternal grandfather. SOCIAL HISTORY:  reports that he has been smoking cigarettes. He has a 10 pack-year smoking history. He has been exposed to tobacco smoke. He has never used smokeless tobacco. He reports that he does not currently use alcohol. He reports that he does not currently use drugs after having used the following drugs: Marijuana.   Review of Systems:  Gen:  Denies  fever, sweats, chills weight loss  HEENT: Denies blurred vision, double vision, ear pain, eye pain, hearing loss, nose bleeds, sore throat Cardiac:  No dizziness, chest pain or heaviness, chest tightness,edema, No JVD Resp:   No cough, -sputum production, -shortness of breath,-wheezing, -hemoptysis,  Gi: Denies swallowing difficulty, stomach pain, nausea or vomiting, diarrhea, constipation, bowel incontinence Gu:  Denies bladder incontinence, burning urine Ext:   Denies Joint pain, stiffness or swelling Skin: Denies  skin rash, easy bruising or bleeding or hives Endoc:  Denies polyuria, polydipsia , polyphagia or weight change Psych:   Denies depression, insomnia or hallucinations  Other:  All other systems negative  VITAL SIGNS: BP (!) 150/98   Pulse 78   Temp 98.5 F (36.9 C)   Ht 6' 3 (1.905 m)   Wt (!) 345 lb (156.5 kg)   SpO2 97%   BMI 43.12 kg/m    Physical Examination:   General Appearance: No distress  EYES PERRLA, EOM intact.   NECK Supple, No JVD Pulmonary: normal breath sounds, No wheezing.  CardiovascularNormal S1,S2.  No m/r/g.   Abdomen: Benign, Soft, non-tender. Skin:   warm, no rashes,  no ecchymosis  Extremities: normal, no cyanosis, clubbing. Neuro:without focal findings,  speech normal  PSYCHIATRIC: Mood, affect within normal limits.   ASSESSMENT AND PLAN  OSA To improve comfort and compliance will decrease max pressure to 10 cm H2O and will try patient on the Airfit N30i nasal mask. Discussed the consequences of untreated sleep apnea. Advised not to drive drowsy for safety of patient and others. Will follow up in 3 months.     HTN Stable, on current management. Following with PCP.   Morbid obesity  Counseled patient on diet and lifestyle modification. Will also try patient on Zepbound and titrate as tolerated.   Insomnia Counseled patient on stimulus control and improving sleep hygiene practices. Will also try patient on Trazodone  50 mg nightly.    Patient  satisfied with Plan of action and management. All questions answered  I spent a total of 25 minutes reviewing chart data, face-to-face evaluation with the patient, counseling and coordination of care as detailed above.    Zailey Audia, M.D.  Sleep Medicine [facility]

## 2024-07-27 NOTE — Patient Instructions (Addendum)

## 2024-09-07 ENCOUNTER — Ambulatory Visit (HOSPITAL_BASED_OUTPATIENT_CLINIC_OR_DEPARTMENT_OTHER): Admitting: Family Medicine

## 2024-09-21 DIAGNOSIS — I10 Essential (primary) hypertension: Secondary | ICD-10-CM

## 2024-09-21 DIAGNOSIS — G4733 Obstructive sleep apnea (adult) (pediatric): Secondary | ICD-10-CM

## 2024-09-21 DIAGNOSIS — F5104 Psychophysiologic insomnia: Secondary | ICD-10-CM

## 2024-09-23 MED ORDER — ZEPBOUND 5 MG/0.5ML ~~LOC~~ SOAJ: 5.0000 mg | SUBCUTANEOUS | 0 refills | Status: AC

## 2024-09-28 ENCOUNTER — Ambulatory Visit (HOSPITAL_BASED_OUTPATIENT_CLINIC_OR_DEPARTMENT_OTHER): Admitting: Family Medicine

## 2024-09-28 VITALS — BP 135/96 | HR 97 | Temp 97.7°F | Resp 18 | Ht 75.0 in | Wt 335.0 lb

## 2024-09-28 DIAGNOSIS — I10 Essential (primary) hypertension: Secondary | ICD-10-CM | POA: Diagnosis not present

## 2024-09-28 DIAGNOSIS — G4733 Obstructive sleep apnea (adult) (pediatric): Secondary | ICD-10-CM

## 2024-09-28 DIAGNOSIS — G4730 Sleep apnea, unspecified: Secondary | ICD-10-CM | POA: Insufficient documentation

## 2024-09-28 DIAGNOSIS — Z0000 Encounter for general adult medical examination without abnormal findings: Secondary | ICD-10-CM

## 2024-09-28 MED ORDER — AMLODIPINE BESYLATE 10 MG PO TABS: 10.0000 mg | ORAL_TABLET | Freq: Every day | ORAL | 1 refills | Status: AC

## 2024-09-28 MED ORDER — TRAZODONE HCL 50 MG PO TABS: 50.0000 mg | ORAL_TABLET | Freq: Every day | ORAL | 1 refills | Status: AC

## 2024-09-28 NOTE — Progress Notes (Unsigned)
" ° ° °  Procedures performed today:    None.  Independent interpretation of notes and tests performed by another provider:   None.  Brief History, Exam, Impression, and Recommendations:    BP (!) 140/104 (BP Location: Right Arm, Patient Position: Sitting, Cuff Size: Large)   Pulse 97   Temp 97.7 F (36.5 C) (Oral)   Resp 18   Ht 6' 3 (1.905 m)   Wt (!) 335 lb (152 kg)   SpO2 95%   BMI 41.87 kg/m   There are no diagnoses linked to this encounter.No follow-ups on file.   ___________________________________________ Zaya Kessenich de [place], MD, ABFM, [facility] Primary Care and Sports Medicine [facility] "

## 2024-09-29 LAB — CBC WITH DIFFERENTIAL/PLATELET
Basophils Absolute: 0.1 x10E3/uL (ref 0.0–0.2)
Basos: 1 %
EOS (ABSOLUTE): 0.2 x10E3/uL (ref 0.0–0.4)
Eos: 2 %
Hematocrit: 46.9 % (ref 37.5–51.0)
Hemoglobin: 15.6 g/dL (ref 13.0–17.7)
Immature Grans (Abs): 0 x10E3/uL (ref 0.0–0.1)
Immature Granulocytes: 0 %
Lymphocytes Absolute: 2.5 x10E3/uL (ref 0.7–3.1)
Lymphs: 20 %
MCH: 32.2 pg (ref 26.6–33.0)
MCHC: 33.3 g/dL (ref 31.5–35.7)
MCV: 97 fL (ref 79–97)
Monocytes Absolute: 0.9 x10E3/uL (ref 0.1–0.9)
Monocytes: 7 %
Neutrophils Absolute: 8.7 x10E3/uL — ABNORMAL HIGH (ref 1.4–7.0)
Neutrophils: 70 %
Platelets: 271 x10E3/uL (ref 150–450)
RBC: 4.84 x10E6/uL (ref 4.14–5.80)
RDW: 11.9 % (ref 11.6–15.4)
WBC: 12.4 x10E3/uL — ABNORMAL HIGH (ref 3.4–10.8)

## 2024-09-29 LAB — HEMOGLOBIN A1C
Est. average glucose Bld gHb Est-mCnc: 100 mg/dL
Hgb A1c MFr Bld: 5.1 % (ref 4.8–5.6)

## 2024-09-29 LAB — COMPREHENSIVE METABOLIC PANEL WITH GFR
ALT: 18 IU/L (ref 0–44)
AST: 19 IU/L (ref 0–40)
Albumin: 4.4 g/dL (ref 4.1–5.1)
Alkaline Phosphatase: 62 IU/L (ref 47–123)
BUN/Creatinine Ratio: 12 (ref 9–20)
BUN: 13 mg/dL (ref 6–24)
Bilirubin Total: 0.5 mg/dL (ref 0.0–1.2)
CO2: 21 mmol/L (ref 20–29)
Calcium: 10 mg/dL (ref 8.7–10.2)
Chloride: 99 mmol/L (ref 96–106)
Creatinine, Ser: 1.08 mg/dL (ref 0.76–1.27)
Globulin, Total: 3.1 g/dL (ref 1.5–4.5)
Glucose: 76 mg/dL (ref 70–99)
Potassium: 4.4 mmol/L (ref 3.5–5.2)
Sodium: 138 mmol/L (ref 134–144)
Total Protein: 7.5 g/dL (ref 6.0–8.5)
eGFR: 86 mL/min/1.73

## 2024-09-29 LAB — LIPID PANEL
Chol/HDL Ratio: 4.7 ratio (ref 0.0–5.0)
Cholesterol, Total: 234 mg/dL — ABNORMAL HIGH (ref 100–199)
HDL: 50 mg/dL
LDL Chol Calc (NIH): 167 mg/dL — ABNORMAL HIGH (ref 0–99)
Triglycerides: 97 mg/dL (ref 0–149)
VLDL Cholesterol Cal: 17 mg/dL (ref 5–40)

## 2024-09-29 LAB — TSH RFX ON ABNORMAL TO FREE T4: TSH: 1.75 u[IU]/mL (ref 0.450–4.500)

## 2024-09-29 NOTE — Assessment & Plan Note (Signed)
 Following with sleep medicine, has had CPAP set up.  He was also started on Zepbound  to assist with weight loss and to treat sleep apnea.  Recommend continued close follow-up with sleep medicine specialist, gradual titration of Zepbound  as tolerated to work towards weight loss

## 2024-09-29 NOTE — Assessment & Plan Note (Signed)
 Recommend gradual increase in weekly physical activity Consider referral to nutritionist He had seen provider in [place] who had discussed GLP-1 receptor agonist during their office visit.  We did review this medication class and occasional difficulty with insurance coverage. He also saw sleep medicine specialist recently who prescribed Zepbound  in relation to management of sleep apnea.  He notes that there has been some back-and-forth with insurance in regards to getting this covered. Would recommend continue with medication due to potential benefit related to sleep apnea as well as assisting with weight loss which can help with numerous other underlying medical issues including hypertension.

## 2024-09-29 NOTE — Assessment & Plan Note (Signed)
 Blood pressure elevated in office today.  He has been taking medication as prescribed, denies experiencing any side effects or issues with amlodipine , has been taking 7.5 mg dose We discussed options and we can proceed with slight dose increase, we will increase to 10 mg, discussed potential side effects. Recommend intermittent monitoring of blood pressure at home, DASH diet.

## 2024-10-05 ENCOUNTER — Other Ambulatory Visit: Payer: Self-pay

## 2024-10-05 ENCOUNTER — Emergency Department (HOSPITAL_BASED_OUTPATIENT_CLINIC_OR_DEPARTMENT_OTHER)
Admission: EM | Admit: 2024-10-05 | Discharge: 2024-10-05 | Disposition: A | Discharge status: Home / Self Care | Attending: Emergency Medicine | Admitting: Emergency Medicine

## 2024-10-05 ENCOUNTER — Encounter (HOSPITAL_BASED_OUTPATIENT_CLINIC_OR_DEPARTMENT_OTHER): Payer: Self-pay

## 2024-10-05 ENCOUNTER — Emergency Department (HOSPITAL_BASED_OUTPATIENT_CLINIC_OR_DEPARTMENT_OTHER)

## 2024-10-05 DIAGNOSIS — I10 Essential (primary) hypertension: Secondary | ICD-10-CM | POA: Insufficient documentation

## 2024-10-05 DIAGNOSIS — R519 Headache, unspecified: Secondary | ICD-10-CM | POA: Insufficient documentation

## 2024-10-05 DIAGNOSIS — Z79899 Other long term (current) drug therapy: Secondary | ICD-10-CM | POA: Insufficient documentation

## 2024-10-05 MED ORDER — KETOROLAC TROMETHAMINE 60 MG/2ML IM SOLN
60.0000 mg | Freq: Once | INTRAMUSCULAR | Status: AC
  Administered 2024-10-05: 60 mg via INTRAMUSCULAR
  Filled 2024-10-05: qty 2

## 2024-10-05 MED ORDER — METOCLOPRAMIDE HCL 5 MG/ML IJ SOLN
10.0000 mg | Freq: Once | INTRAMUSCULAR | Status: AC
  Administered 2024-10-05: 10 mg via INTRAMUSCULAR
  Filled 2024-10-05: qty 2
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
## 2024-10-05 NOTE — Discharge Instructions (Signed)
 Take ibuprofen 600 mg rotated with Tylenol 1000 mg every 4 hours as needed for pain.  Follow-up with primary doctor if symptoms persist, and return to the ER if symptoms significantly worsen or change.

## 2024-10-05 NOTE — ED Triage Notes (Signed)
 Complaining of a headache since last Monday. Is in the front behind his eyes.

## 2024-10-26 ENCOUNTER — Ambulatory Visit: Admitting: Sleep Medicine

## 2024-11-16 ENCOUNTER — Ambulatory Visit (HOSPITAL_BASED_OUTPATIENT_CLINIC_OR_DEPARTMENT_OTHER): Admitting: Family Medicine

## 2025-03-09 ENCOUNTER — Ambulatory Visit: Admitting: Allergy
